# Patient Record
Sex: Female | Born: 1980 | Race: Black or African American | Hispanic: No | Marital: Single | Smoking: Current every day smoker
Health system: Southern US, Community
[De-identification: ages and names within clinical notes are randomized; demographics above are authoritative.]

## PROBLEM LIST (undated history)

## (undated) DIAGNOSIS — E669 Obesity, unspecified: Secondary | ICD-10-CM

---
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
## 2003-07-13 ENCOUNTER — Encounter: Payer: Self-pay | Admitting: Emergency Medicine

## 2003-07-13 ENCOUNTER — Emergency Department (HOSPITAL_COMMUNITY): Admission: EM | Admit: 2003-07-13 | Discharge: 2003-07-13 | Payer: Self-pay | Admitting: Emergency Medicine

## 2003-08-15 ENCOUNTER — Inpatient Hospital Stay (HOSPITAL_COMMUNITY): Admission: AD | Admit: 2003-08-15 | Discharge: 2003-08-15 | Payer: Self-pay | Admitting: Specialist

## 2003-08-21 ENCOUNTER — Encounter: Payer: Self-pay | Admitting: Obstetrics & Gynecology

## 2003-08-21 ENCOUNTER — Ambulatory Visit (HOSPITAL_COMMUNITY): Admission: RE | Admit: 2003-08-21 | Discharge: 2003-08-21 | Payer: Self-pay | Admitting: Obstetrics & Gynecology

## 2003-10-23 ENCOUNTER — Inpatient Hospital Stay (HOSPITAL_COMMUNITY): Admission: AD | Admit: 2003-10-23 | Discharge: 2003-11-01 | Payer: Self-pay | Admitting: Obstetrics

## 2003-10-24 ENCOUNTER — Encounter (INDEPENDENT_AMBULATORY_CARE_PROVIDER_SITE_OTHER): Payer: Self-pay

## 2004-09-17 ENCOUNTER — Ambulatory Visit: Payer: Self-pay | Admitting: Family Medicine

## 2004-09-30 ENCOUNTER — Ambulatory Visit: Payer: Self-pay | Admitting: Family Medicine

## 2004-12-09 ENCOUNTER — Ambulatory Visit: Payer: Self-pay | Admitting: Family Medicine

## 2005-02-10 ENCOUNTER — Ambulatory Visit: Payer: Self-pay | Admitting: Family Medicine

## 2005-03-18 ENCOUNTER — Ambulatory Visit: Payer: Self-pay | Admitting: Family Medicine

## 2005-03-19 ENCOUNTER — Ambulatory Visit: Payer: Self-pay | Admitting: Family Medicine

## 2005-04-10 ENCOUNTER — Ambulatory Visit: Payer: Self-pay | Admitting: Family Medicine

## 2005-04-11 ENCOUNTER — Ambulatory Visit (HOSPITAL_COMMUNITY): Admission: RE | Admit: 2005-04-11 | Discharge: 2005-04-11 | Payer: Self-pay | Admitting: Family Medicine

## 2005-06-16 ENCOUNTER — Ambulatory Visit: Payer: Self-pay | Admitting: Family Medicine

## 2005-06-17 ENCOUNTER — Ambulatory Visit: Payer: Self-pay | Admitting: Family Medicine

## 2005-08-24 ENCOUNTER — Emergency Department (HOSPITAL_COMMUNITY): Admission: EM | Admit: 2005-08-24 | Discharge: 2005-08-24 | Payer: Self-pay | Admitting: Emergency Medicine

## 2005-08-25 ENCOUNTER — Ambulatory Visit: Payer: Self-pay | Admitting: Nurse Practitioner

## 2005-08-27 ENCOUNTER — Emergency Department (HOSPITAL_COMMUNITY): Admission: EM | Admit: 2005-08-27 | Discharge: 2005-08-27 | Payer: Self-pay | Admitting: Emergency Medicine

## 2005-09-05 ENCOUNTER — Ambulatory Visit: Payer: Self-pay | Admitting: Family Medicine

## 2005-09-08 ENCOUNTER — Ambulatory Visit: Payer: Self-pay | Admitting: Family Medicine

## 2005-10-07 ENCOUNTER — Other Ambulatory Visit: Admission: RE | Admit: 2005-10-07 | Discharge: 2005-10-07 | Payer: Self-pay | Admitting: Family Medicine

## 2005-10-07 ENCOUNTER — Ambulatory Visit: Payer: Self-pay | Admitting: Family Medicine

## 2005-10-07 LAB — CONVERTED CEMR LAB: Pap Smear: ABNORMAL

## 2005-12-04 ENCOUNTER — Ambulatory Visit: Payer: Self-pay | Admitting: Family Medicine

## 2005-12-25 ENCOUNTER — Ambulatory Visit: Payer: Self-pay | Admitting: Family Medicine

## 2006-04-01 ENCOUNTER — Ambulatory Visit: Payer: Self-pay | Admitting: Family Medicine

## 2006-04-02 ENCOUNTER — Ambulatory Visit: Payer: Self-pay | Admitting: Family Medicine

## 2006-05-15 ENCOUNTER — Ambulatory Visit: Payer: Self-pay | Admitting: Family Medicine

## 2006-05-30 ENCOUNTER — Emergency Department (HOSPITAL_COMMUNITY): Admission: EM | Admit: 2006-05-30 | Discharge: 2006-05-30 | Payer: Self-pay | Admitting: Family Medicine

## 2006-05-31 ENCOUNTER — Emergency Department (HOSPITAL_COMMUNITY): Admission: EM | Admit: 2006-05-31 | Discharge: 2006-05-31 | Payer: Self-pay | Admitting: Family Medicine

## 2006-06-02 ENCOUNTER — Ambulatory Visit: Payer: Self-pay | Admitting: Family Medicine

## 2006-08-26 ENCOUNTER — Emergency Department (HOSPITAL_COMMUNITY): Admission: EM | Admit: 2006-08-26 | Discharge: 2006-08-26 | Payer: Self-pay | Admitting: Family Medicine

## 2006-08-31 ENCOUNTER — Emergency Department (HOSPITAL_COMMUNITY): Admission: EM | Admit: 2006-08-31 | Discharge: 2006-08-31 | Payer: Self-pay | Admitting: Family Medicine

## 2006-11-06 ENCOUNTER — Emergency Department (HOSPITAL_COMMUNITY): Admission: EM | Admit: 2006-11-06 | Discharge: 2006-11-06 | Payer: Self-pay | Admitting: Emergency Medicine

## 2007-05-20 ENCOUNTER — Emergency Department (HOSPITAL_COMMUNITY): Admission: EM | Admit: 2007-05-20 | Discharge: 2007-05-20 | Payer: Self-pay | Admitting: Family Medicine

## 2007-06-03 ENCOUNTER — Emergency Department (HOSPITAL_COMMUNITY): Admission: EM | Admit: 2007-06-03 | Discharge: 2007-06-03 | Payer: Self-pay | Admitting: Emergency Medicine

## 2007-06-07 ENCOUNTER — Encounter (INDEPENDENT_AMBULATORY_CARE_PROVIDER_SITE_OTHER): Payer: Self-pay | Admitting: Family Medicine

## 2007-06-07 DIAGNOSIS — F329 Major depressive disorder, single episode, unspecified: Secondary | ICD-10-CM

## 2007-06-07 DIAGNOSIS — E785 Hyperlipidemia, unspecified: Secondary | ICD-10-CM | POA: Insufficient documentation

## 2007-06-07 DIAGNOSIS — M545 Low back pain: Secondary | ICD-10-CM

## 2007-06-07 DIAGNOSIS — I10 Essential (primary) hypertension: Secondary | ICD-10-CM | POA: Insufficient documentation

## 2007-08-09 ENCOUNTER — Inpatient Hospital Stay (HOSPITAL_COMMUNITY): Admission: RE | Admit: 2007-08-09 | Discharge: 2007-08-11 | Payer: Self-pay | Admitting: *Deleted

## 2007-08-09 ENCOUNTER — Ambulatory Visit: Payer: Self-pay | Admitting: *Deleted

## 2007-08-09 ENCOUNTER — Emergency Department (HOSPITAL_COMMUNITY): Admission: EM | Admit: 2007-08-09 | Discharge: 2007-08-09 | Payer: Self-pay | Admitting: Emergency Medicine

## 2007-08-12 ENCOUNTER — Other Ambulatory Visit (HOSPITAL_COMMUNITY): Admission: RE | Admit: 2007-08-12 | Discharge: 2007-08-26 | Payer: Self-pay | Admitting: Psychiatry

## 2007-09-08 ENCOUNTER — Emergency Department (HOSPITAL_COMMUNITY): Admission: EM | Admit: 2007-09-08 | Discharge: 2007-09-09 | Payer: Self-pay | Admitting: Emergency Medicine

## 2007-09-16 ENCOUNTER — Other Ambulatory Visit (HOSPITAL_COMMUNITY): Admission: RE | Admit: 2007-09-16 | Discharge: 2007-12-15 | Payer: Self-pay | Admitting: Psychiatry

## 2007-09-24 ENCOUNTER — Ambulatory Visit (HOSPITAL_COMMUNITY): Payer: Self-pay | Admitting: Psychiatry

## 2007-09-29 ENCOUNTER — Ambulatory Visit (HOSPITAL_COMMUNITY): Payer: Self-pay | Admitting: Psychiatry

## 2007-10-12 ENCOUNTER — Ambulatory Visit (HOSPITAL_COMMUNITY): Payer: Self-pay | Admitting: Psychiatry

## 2007-12-02 ENCOUNTER — Inpatient Hospital Stay (HOSPITAL_COMMUNITY): Admission: AD | Admit: 2007-12-02 | Discharge: 2007-12-02 | Payer: Self-pay | Admitting: Obstetrics & Gynecology

## 2008-01-11 ENCOUNTER — Inpatient Hospital Stay (HOSPITAL_COMMUNITY): Admission: AD | Admit: 2008-01-11 | Discharge: 2008-01-11 | Payer: Self-pay | Admitting: Obstetrics & Gynecology

## 2008-03-01 ENCOUNTER — Ambulatory Visit (HOSPITAL_COMMUNITY): Admission: RE | Admit: 2008-03-01 | Discharge: 2008-03-01 | Payer: Self-pay | Admitting: Obstetrics

## 2008-04-27 ENCOUNTER — Emergency Department (HOSPITAL_COMMUNITY): Admission: EM | Admit: 2008-04-27 | Discharge: 2008-04-27 | Payer: Self-pay | Admitting: Family Medicine

## 2008-07-01 ENCOUNTER — Inpatient Hospital Stay (HOSPITAL_COMMUNITY): Admission: AD | Admit: 2008-07-01 | Discharge: 2008-07-01 | Payer: Self-pay | Admitting: Obstetrics & Gynecology

## 2008-07-10 ENCOUNTER — Inpatient Hospital Stay (HOSPITAL_COMMUNITY): Admission: AD | Admit: 2008-07-10 | Discharge: 2008-07-10 | Payer: Self-pay | Admitting: Obstetrics & Gynecology

## 2008-07-20 ENCOUNTER — Inpatient Hospital Stay (HOSPITAL_COMMUNITY): Admission: AD | Admit: 2008-07-20 | Discharge: 2008-07-20 | Payer: Self-pay | Admitting: Obstetrics

## 2008-07-27 ENCOUNTER — Encounter: Payer: Self-pay | Admitting: Obstetrics

## 2008-07-27 ENCOUNTER — Inpatient Hospital Stay (HOSPITAL_COMMUNITY): Admission: RE | Admit: 2008-07-27 | Discharge: 2008-07-30 | Payer: Self-pay | Admitting: Obstetrics

## 2008-10-11 ENCOUNTER — Emergency Department (HOSPITAL_COMMUNITY): Admission: EM | Admit: 2008-10-11 | Discharge: 2008-10-11 | Payer: Self-pay | Admitting: Family Medicine

## 2008-12-08 IMAGING — US US OB TRANSVAGINAL MODIFY
1 series · 14 of 28 positions shown · non-contrast
Comparison: none

CLINICAL DATA: Patient reportedly 12 weeks pregnant.  Pain in lower stomach.
 OBSTETRICAL ULTRASOUND <14 WKS AND TRANSVAGINAL OB US:
TECHNIQUE: Both transabdominal and transvaginal ultrasound examinations were performed for complete evaluation of the gestation as well as the maternal uterus, adnexal regions, and pelvic cul-de-sac.

[Series 1: us ob transvaginal modify · 0.31mm/px · 45 acquisitions, 14 frames shown]
[im 2/45]
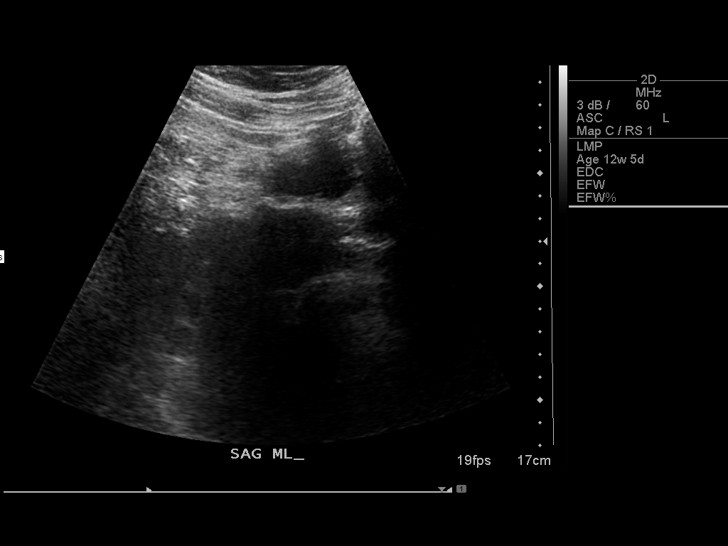
[im 5/45]
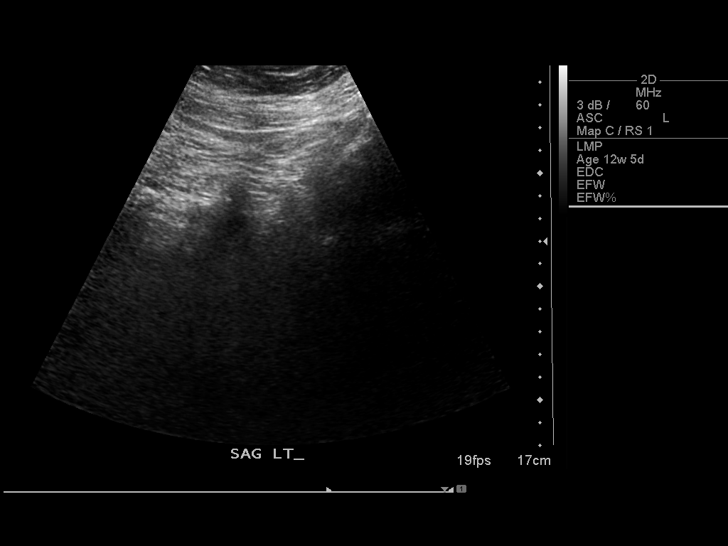
[im 9/45]
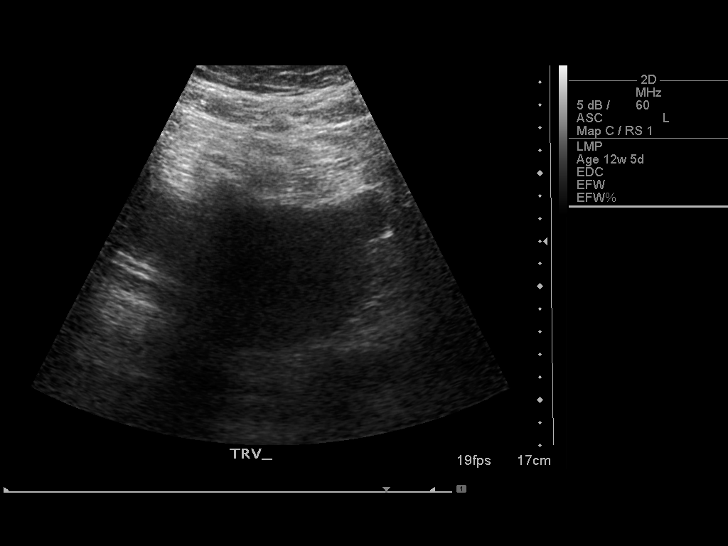
[im 12/45]
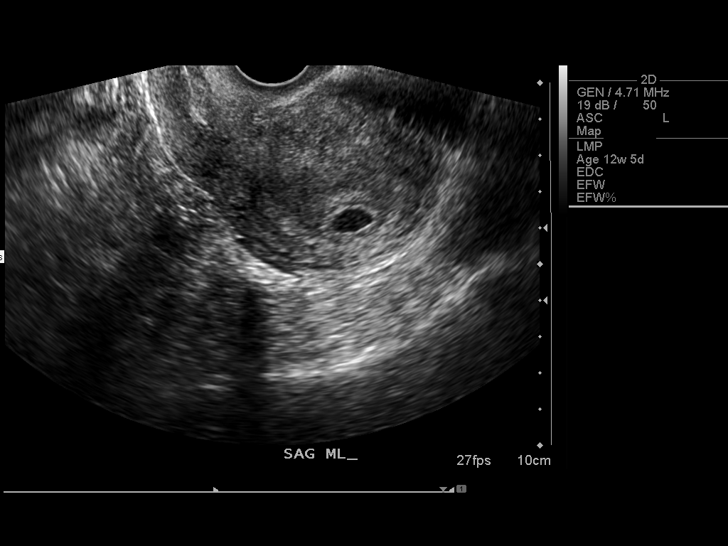
[im 15/45]
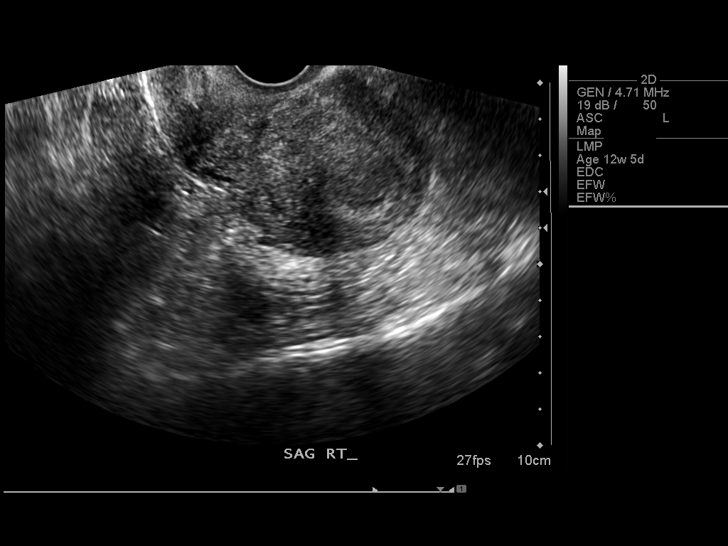
[im 18/45]
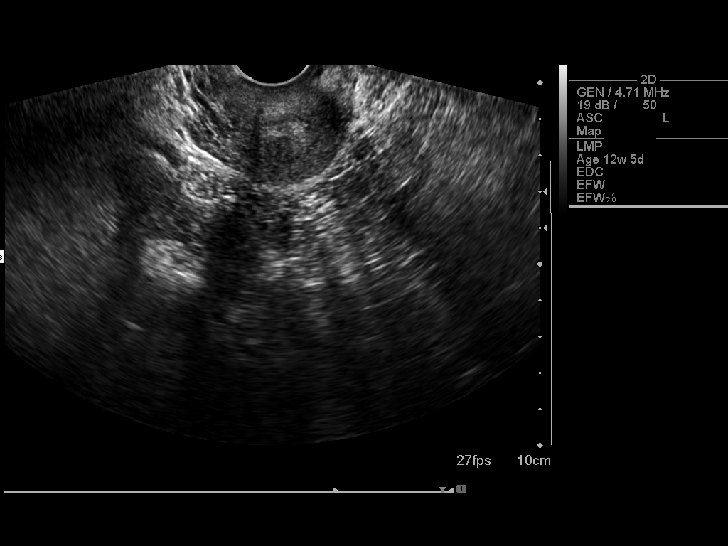
[im 22/45]
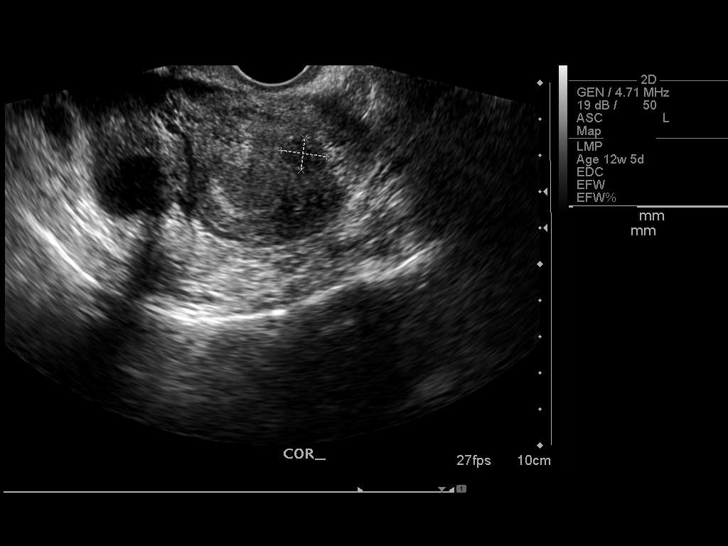
[im 25/45]
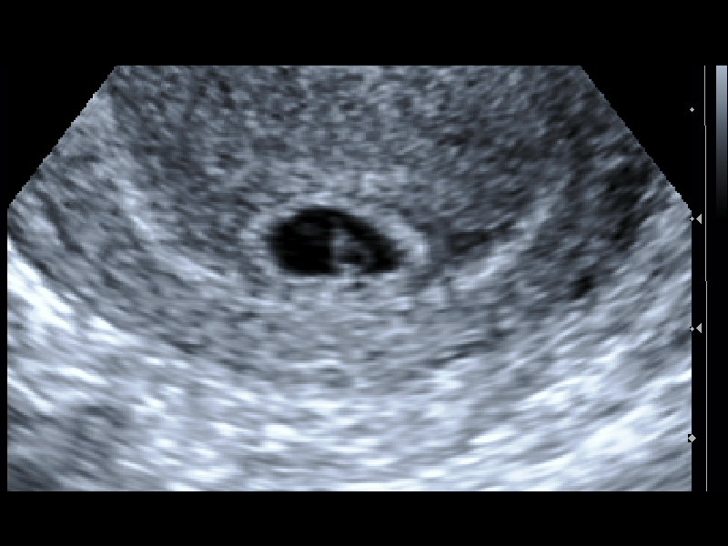
[im 28/45]
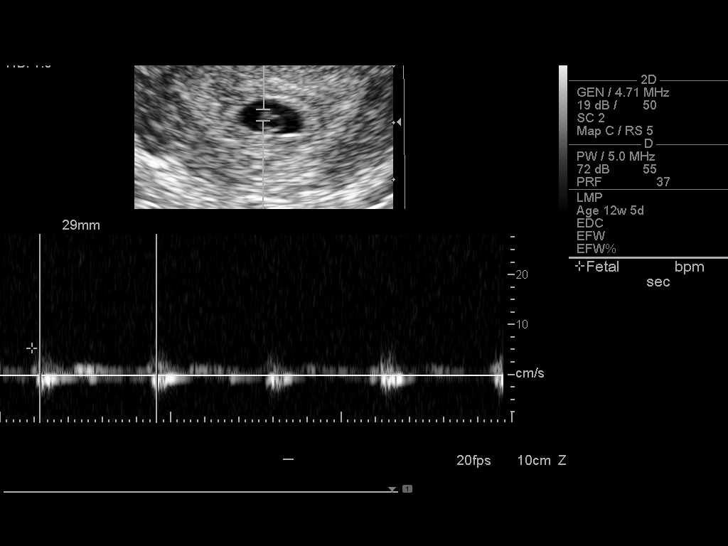
[im 31/45]
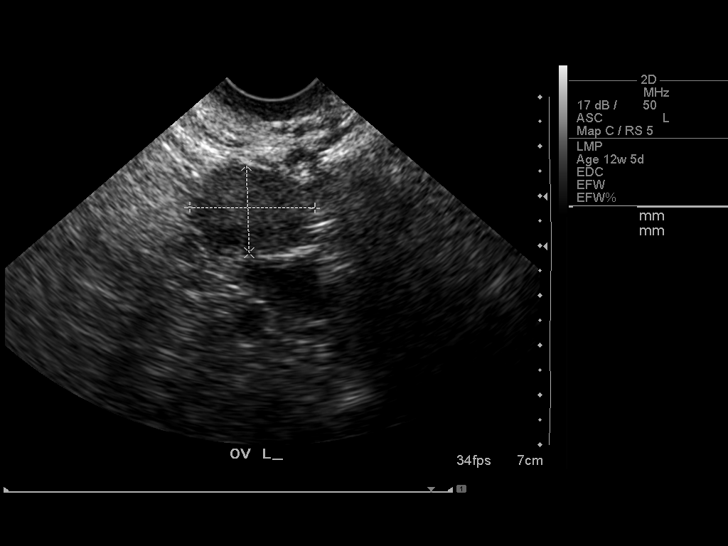
[im 35/45]
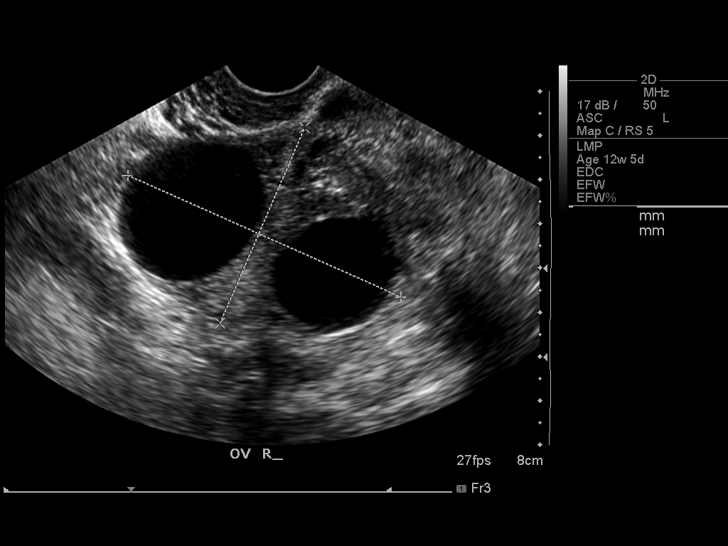
[im 38/45]
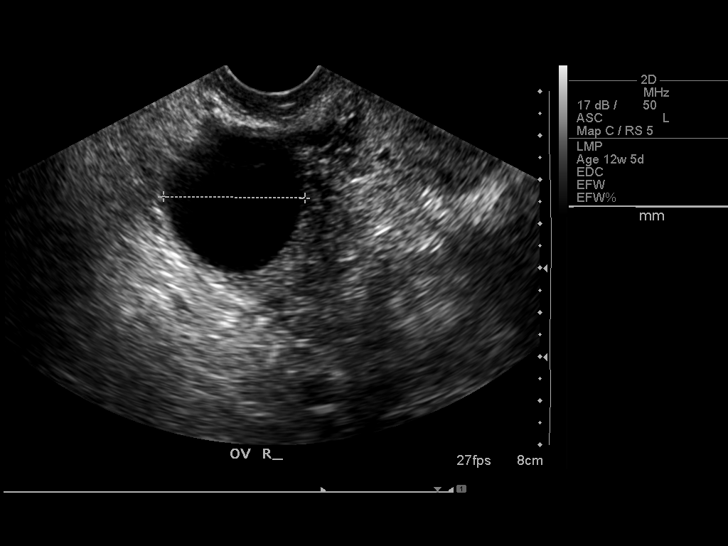
[im 41/45]
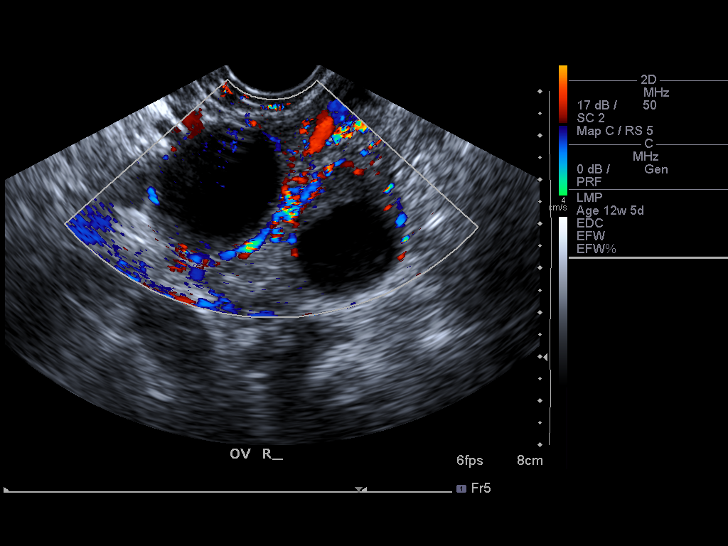
[im 45/45]
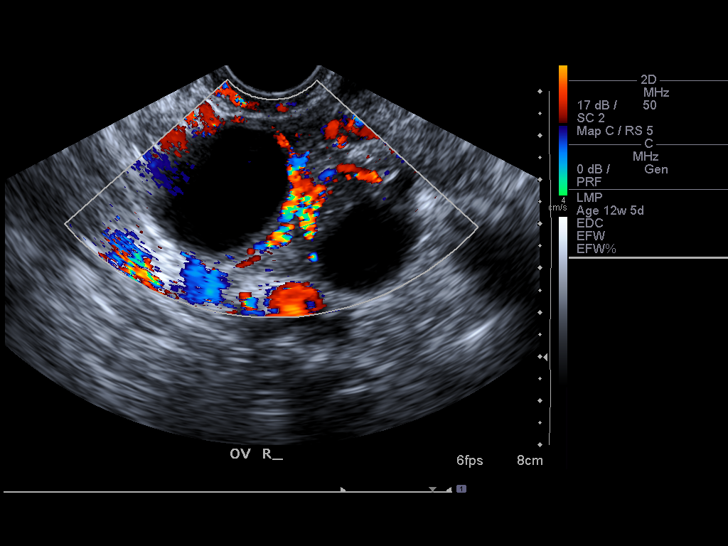

[14 of 28 positions shown; findings below may reference images not displayed]

FINDINGS: Intrauterine gestational sac is identified.  Yolk sac is identified.  Embryo with cardiac activity noted.  Cardiac activity 89 and regular.  Crown-rump length 2.1 mm corresponds with EDC 5 weeks 5 days.  There is no subchorionic hematoma.  Within the right ovary, two simple cysts identified.  Left ovary is normal.  Note is made of a 1.2 x 0.8 cm fibroid within the uterine fundus.
IMPRESSION: Single intrauterine fetus identified.  Estimated gestational age 5 weeks 5 days basewed on a crown-rump length 2.1 mm.  This corresponds with EDC of 07/29/08.

## 2009-01-30 ENCOUNTER — Inpatient Hospital Stay (HOSPITAL_COMMUNITY): Admission: RE | Admit: 2009-01-30 | Discharge: 2009-02-07 | Payer: Self-pay | Admitting: Internal Medicine

## 2009-01-30 ENCOUNTER — Emergency Department (HOSPITAL_COMMUNITY): Admission: EM | Admit: 2009-01-30 | Discharge: 2009-01-30 | Payer: Self-pay | Admitting: Emergency Medicine

## 2009-01-31 ENCOUNTER — Ambulatory Visit: Payer: Self-pay | Admitting: Psychiatry

## 2009-02-28 ENCOUNTER — Ambulatory Visit (HOSPITAL_COMMUNITY): Payer: Self-pay | Admitting: Psychiatry

## 2009-03-25 ENCOUNTER — Ambulatory Visit: Payer: Self-pay | Admitting: Advanced Practice Midwife

## 2009-03-25 ENCOUNTER — Inpatient Hospital Stay (HOSPITAL_COMMUNITY): Admission: AD | Admit: 2009-03-25 | Discharge: 2009-03-25 | Payer: Self-pay | Admitting: Obstetrics & Gynecology

## 2009-04-04 ENCOUNTER — Other Ambulatory Visit (HOSPITAL_COMMUNITY): Admission: RE | Admit: 2009-04-04 | Discharge: 2009-04-19 | Payer: Self-pay | Admitting: Psychiatry

## 2009-04-11 ENCOUNTER — Emergency Department (HOSPITAL_COMMUNITY): Admission: EM | Admit: 2009-04-11 | Discharge: 2009-04-11 | Payer: Self-pay | Admitting: Emergency Medicine

## 2009-07-28 ENCOUNTER — Inpatient Hospital Stay (HOSPITAL_COMMUNITY): Admission: AD | Admit: 2009-07-28 | Discharge: 2009-07-29 | Payer: Self-pay | Admitting: Obstetrics and Gynecology

## 2009-08-16 ENCOUNTER — Inpatient Hospital Stay (HOSPITAL_COMMUNITY): Admission: AD | Admit: 2009-08-16 | Discharge: 2009-08-16 | Payer: Self-pay | Admitting: Obstetrics and Gynecology

## 2009-08-17 ENCOUNTER — Inpatient Hospital Stay (HOSPITAL_COMMUNITY): Admission: AD | Admit: 2009-08-17 | Discharge: 2009-08-18 | Payer: Self-pay | Admitting: Obstetrics & Gynecology

## 2009-08-20 ENCOUNTER — Inpatient Hospital Stay (HOSPITAL_COMMUNITY): Admission: AD | Admit: 2009-08-20 | Discharge: 2009-08-20 | Payer: Self-pay | Admitting: Obstetrics & Gynecology

## 2009-08-20 ENCOUNTER — Inpatient Hospital Stay (HOSPITAL_COMMUNITY): Admission: AD | Admit: 2009-08-20 | Discharge: 2009-08-21 | Payer: Self-pay | Admitting: Obstetrics and Gynecology

## 2009-08-22 ENCOUNTER — Inpatient Hospital Stay (HOSPITAL_COMMUNITY): Admission: AD | Admit: 2009-08-22 | Discharge: 2009-08-23 | Payer: Self-pay | Admitting: Obstetrics and Gynecology

## 2009-09-17 ENCOUNTER — Inpatient Hospital Stay (HOSPITAL_COMMUNITY): Admission: RE | Admit: 2009-09-17 | Discharge: 2009-09-20 | Payer: Self-pay | Admitting: Obstetrics & Gynecology

## 2009-09-17 ENCOUNTER — Encounter (INDEPENDENT_AMBULATORY_CARE_PROVIDER_SITE_OTHER): Payer: Self-pay | Admitting: Obstetrics & Gynecology

## 2009-09-24 ENCOUNTER — Other Ambulatory Visit: Payer: Self-pay | Admitting: Emergency Medicine

## 2009-09-24 ENCOUNTER — Other Ambulatory Visit: Payer: Self-pay

## 2009-09-24 ENCOUNTER — Inpatient Hospital Stay (HOSPITAL_COMMUNITY): Admission: EM | Admit: 2009-09-24 | Discharge: 2009-09-27 | Payer: Self-pay | Admitting: Psychiatry

## 2009-09-24 ENCOUNTER — Ambulatory Visit: Payer: Self-pay | Admitting: Psychiatry

## 2010-02-06 IMAGING — US US TRANSVAGINAL NON-OB
1 series · 14 of 25 positions shown · non-contrast
Comparison: None available.

CLINICAL DATA: Medical clearance.  Suicidal.  Positive urine
pregnancy test.  LMP 12/15/2008.

TWIN OBSTETRIC <14WK US AND TRANSVAGINAL OB US

[Series 1: us transvaginal non-ob · 0.35mm/px · 14 of 38 slices shown]
[im 1/38]
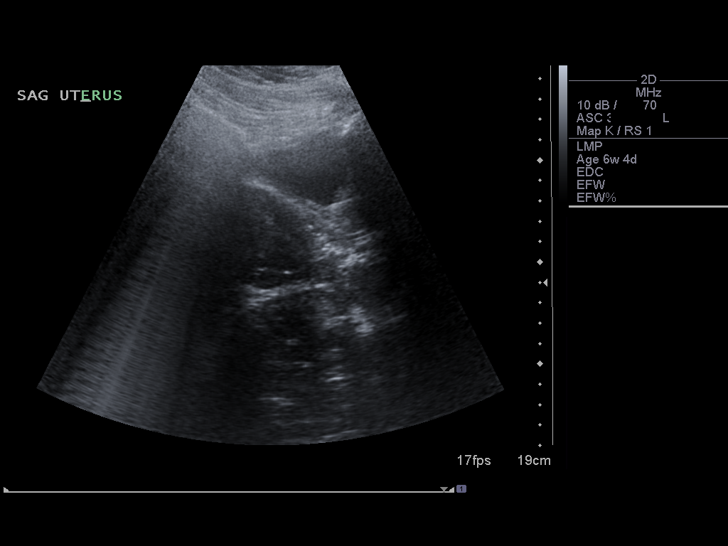
[im 4/38]
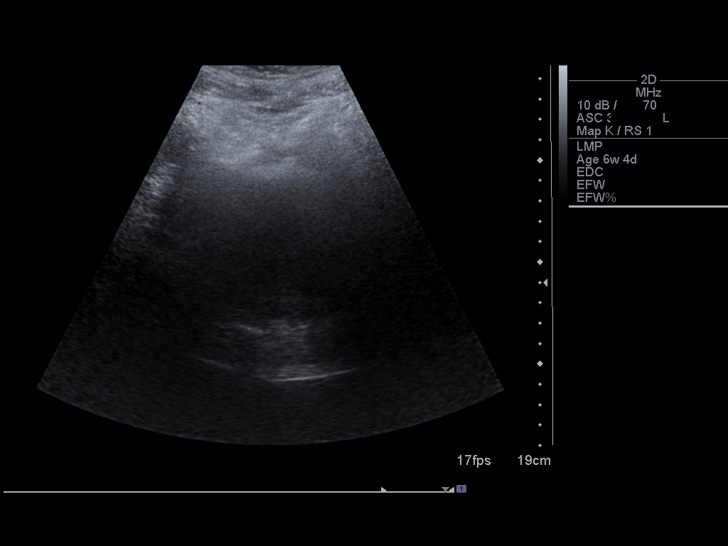
[im 7/38]
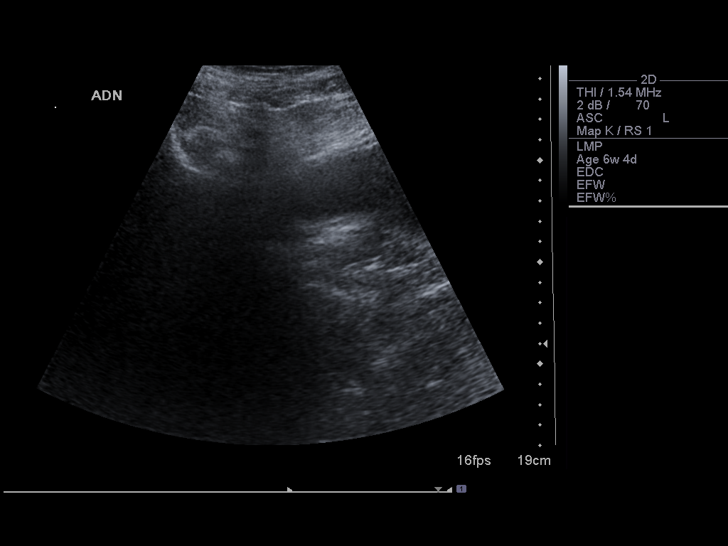
[im 10/38]
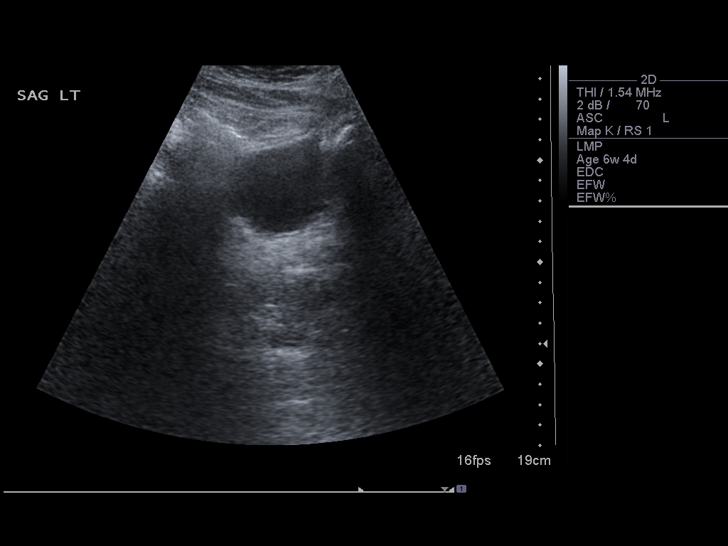
[im 13/38]
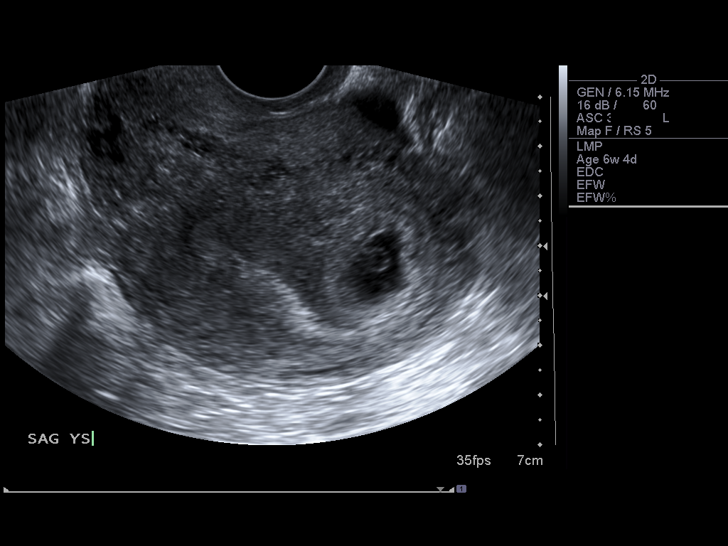
[im 14/38]
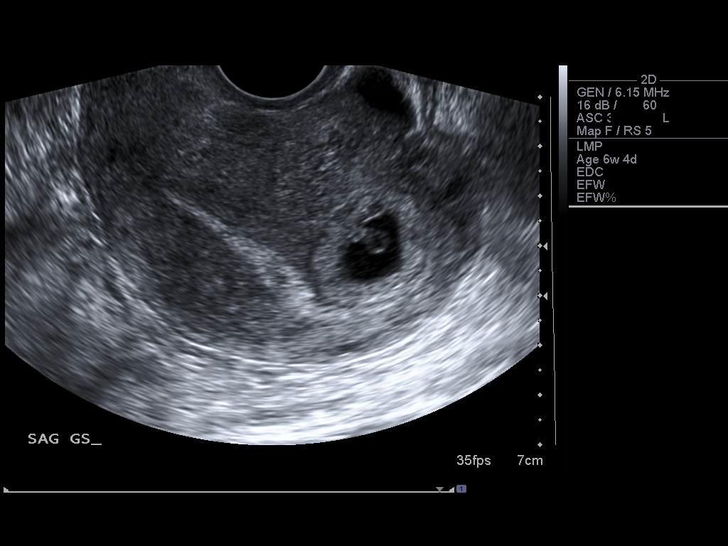
[im 17/38]
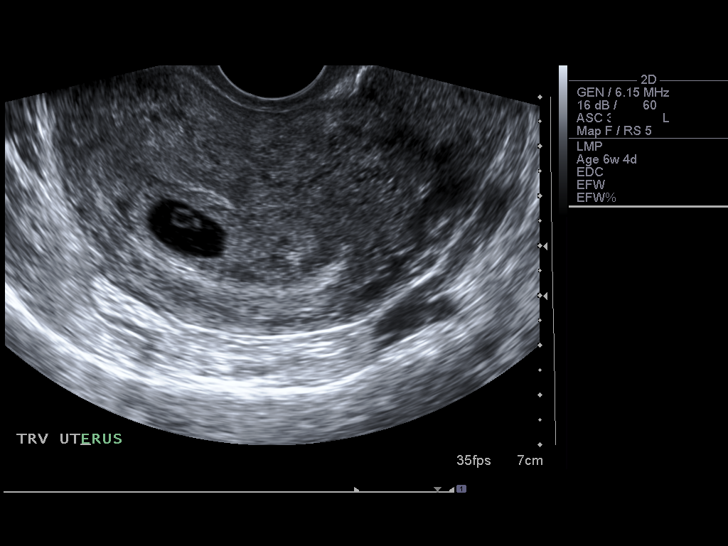
[im 21/38]
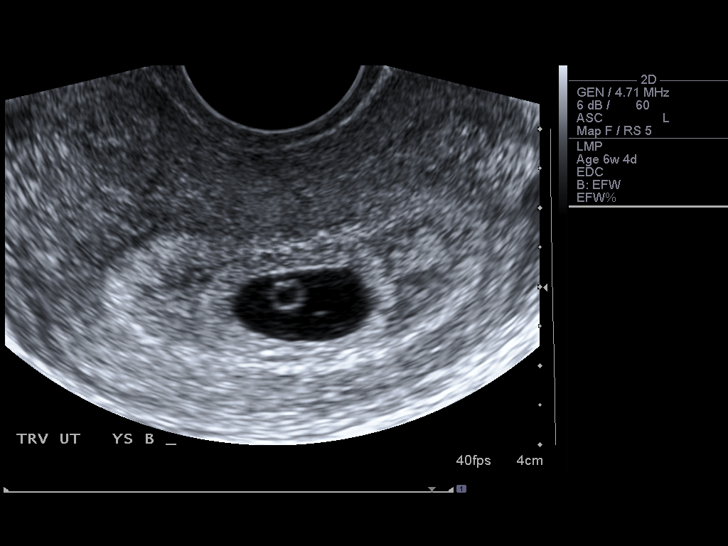
[im 24/38]
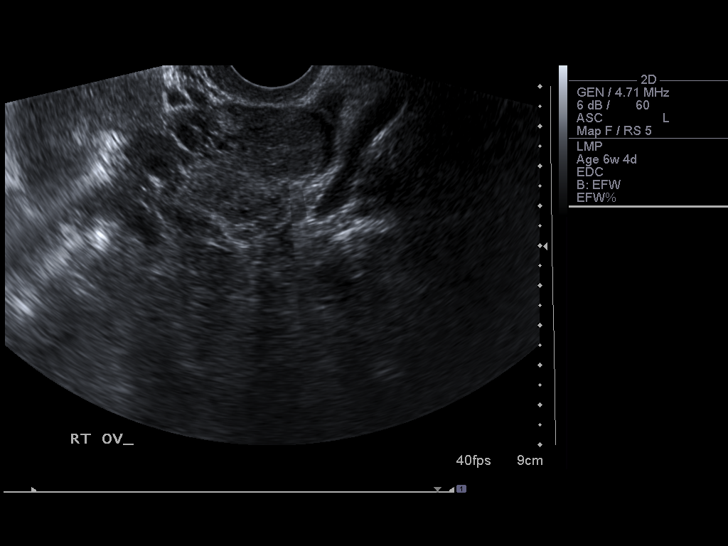
[im 25/38]
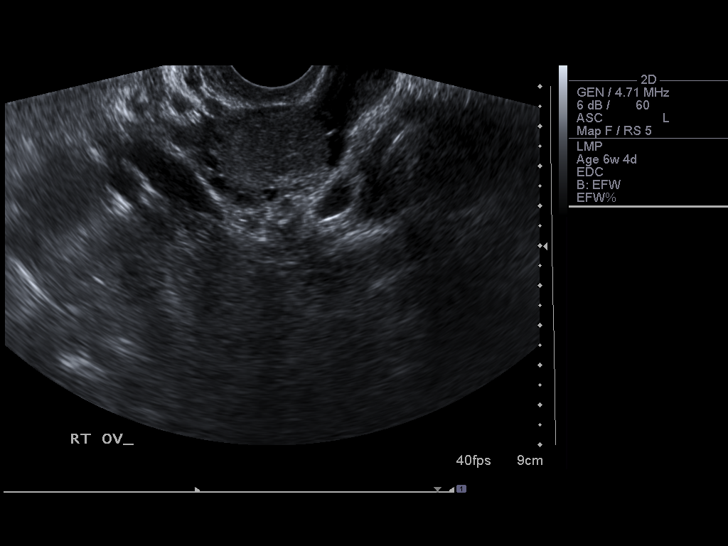
[im 28/38]
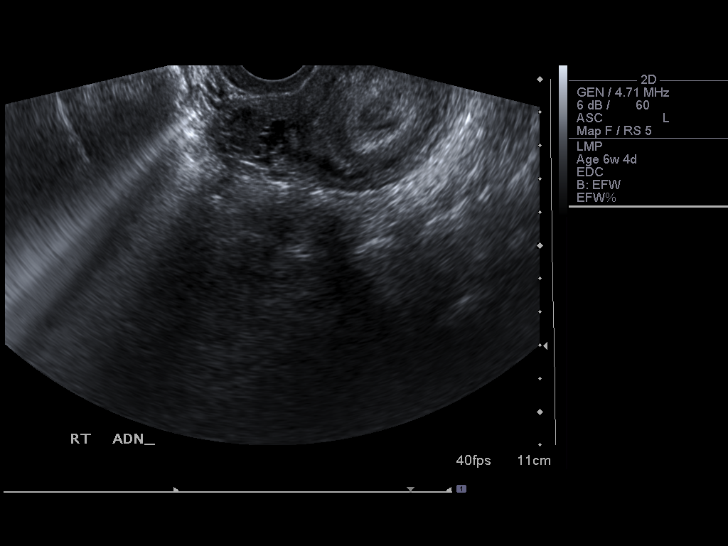
[im 31/38]
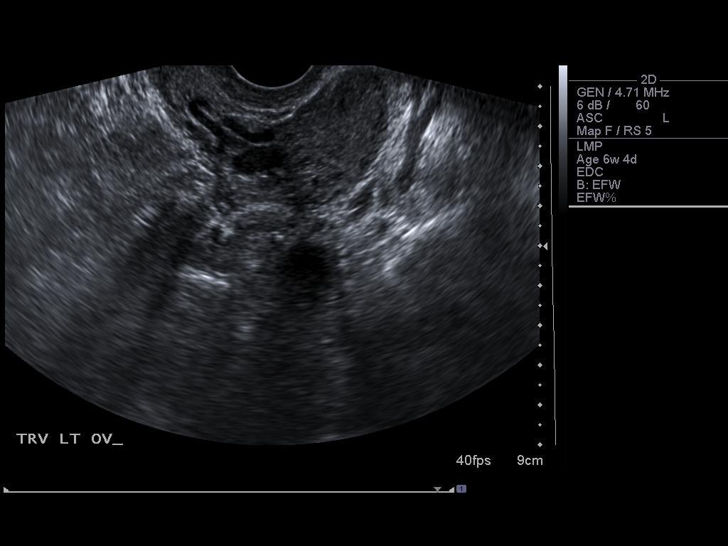
[im 34/38]
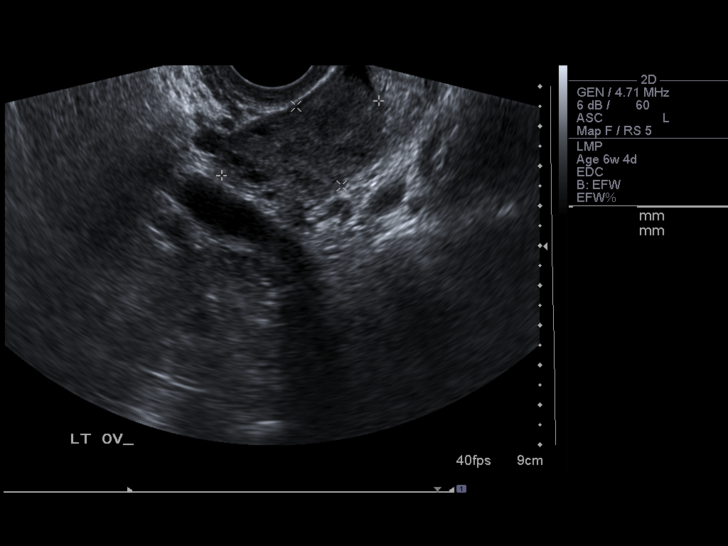
[im 38/38]
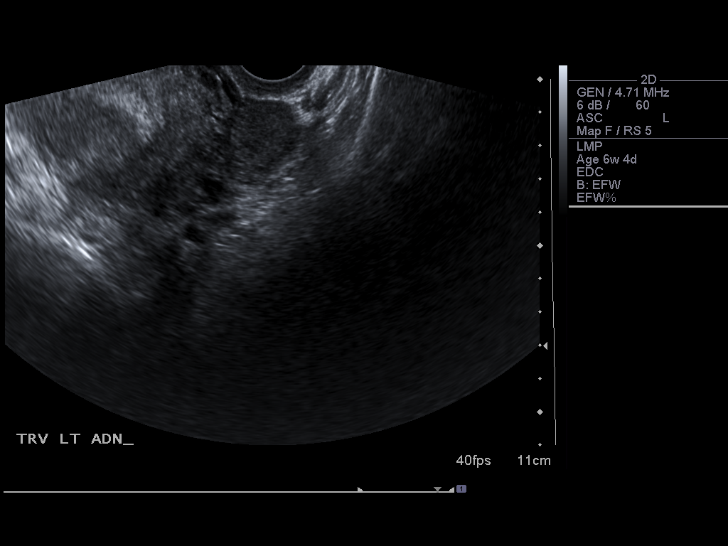

[14 of 25 positions shown; findings below may reference images not displayed]

TWIN A
Heart rate: 121 bpm
Yolk sac:  Present.

Embryo:  Present.

CRL: 5.3 mm 6w   2d          US EDC: 09/23/2009

Complete fetal anatomic evaluation could not be performed due to
early GA.

TWIN B
Heart rate:  889bpm
Yolk sac:  Present.

Embryo:  Present.

CRL:  4.9 mm      6w    2d          US EDC: 09/23/2009

Complete fetal anatomic evaluation could not be performed due to
early GA.

Maternal uterus/adnexae:
The ovaries are within normal limits bilaterally, measuring 3.3 x
2.2 x 2.0 cm on the right and 4.4 x 2.3 x 2.4 cm on the left.
There is no significant free fluid.  There is no significant
subchorionic hemorrhage. A thick septum divides the gestational
sacs.  Definitive characterization can be made after the placenta
is visible.
IMPRESSION: 1.  Early gestational twin pregnancy.
2.  The estimated gestational age is 6 weeks and 2 days.

## 2010-04-01 ENCOUNTER — Emergency Department (HOSPITAL_COMMUNITY): Admission: EM | Admit: 2010-04-01 | Discharge: 2010-04-01 | Payer: Self-pay | Admitting: Emergency Medicine

## 2010-04-01 ENCOUNTER — Ambulatory Visit: Payer: Self-pay | Admitting: Psychiatry

## 2010-04-01 ENCOUNTER — Inpatient Hospital Stay (HOSPITAL_COMMUNITY): Admission: EM | Admit: 2010-04-01 | Discharge: 2010-04-10 | Payer: Self-pay | Admitting: Psychiatry

## 2010-06-07 ENCOUNTER — Emergency Department (HOSPITAL_COMMUNITY): Admission: EM | Admit: 2010-06-07 | Discharge: 2010-06-07 | Payer: Self-pay | Admitting: Emergency Medicine

## 2010-10-25 ENCOUNTER — Emergency Department (HOSPITAL_COMMUNITY): Admission: EM | Admit: 2010-10-25 | Discharge: 2010-10-25 | Payer: Self-pay | Admitting: Family Medicine

## 2010-12-29 ENCOUNTER — Encounter: Payer: Self-pay | Admitting: Obstetrics & Gynecology

## 2011-02-25 LAB — DIFFERENTIAL
Basophils Absolute: 0.1 10*3/uL (ref 0.0–0.1)
Basophils Relative: 1 % (ref 0–1)
Eosinophils Absolute: 0.3 10*3/uL (ref 0.0–0.7)
Eosinophils Relative: 4 % (ref 0–5)
Lymphocytes Relative: 41 % (ref 12–46)
Lymphs Abs: 2.6 10*3/uL (ref 0.7–4.0)
Monocytes Absolute: 0.4 10*3/uL (ref 0.1–1.0)
Monocytes Relative: 7 % (ref 3–12)
Neutro Abs: 3 10*3/uL (ref 1.7–7.7)
Neutrophils Relative %: 47 % (ref 43–77)

## 2011-02-25 LAB — BASIC METABOLIC PANEL
BUN: 10 mg/dL (ref 6–23)
CO2: 21 mEq/L (ref 19–32)
Calcium: 8.7 mg/dL (ref 8.4–10.5)
Chloride: 104 mEq/L (ref 96–112)
Creatinine, Ser: 0.66 mg/dL (ref 0.4–1.2)
GFR calc Af Amer: >60 mL/min (ref >60)
GFR calc non Af Amer: >60 mL/min (ref >60)
Glucose, Bld: 91 mg/dL (ref 70–99)
Potassium: 3.7 mEq/L (ref 3.5–5.1)
Sodium: 135 mEq/L (ref 135–145)

## 2011-02-25 LAB — CBC
HCT: 35.2 % — ABNORMAL LOW (ref 36.0–46.0)
Hemoglobin: 11.3 g/dL — ABNORMAL LOW (ref 12.0–15.0)
MCHC: 32 g/dL (ref 30.0–36.0)
MCV: 81.3 fL (ref 78.0–100.0)
Platelets: 192 10*3/uL (ref 150–400)
RBC: 4.33 MIL/uL (ref 3.87–5.11)
RDW: 17.1 % — ABNORMAL HIGH (ref 11.5–15.5)
WBC: 6.3 10*3/uL (ref 4.0–10.5)

## 2011-02-25 LAB — URINALYSIS, ROUTINE W REFLEX MICROSCOPIC
Bilirubin Urine: NEGATIVE
Glucose, UA: NEGATIVE mg/dL
Hgb urine dipstick: NEGATIVE
Ketones, ur: NEGATIVE mg/dL
Nitrite: NEGATIVE
Protein, ur: NEGATIVE mg/dL
Specific Gravity, Urine: 1.008 (ref 1.005–1.030)
Urobilinogen, UA: 0.2 mg/dL (ref 0.0–1.0)
pH: 6 (ref 5.0–8.0)

## 2011-02-25 LAB — GC/CHLAMYDIA PROBE AMP, URINE
Chlamydia, Swab/Urine, PCR: NEGATIVE
GC Probe Amp, Urine: NEGATIVE

## 2011-02-25 LAB — RAPID URINE DRUG SCREEN, HOSP PERFORMED
Amphetamines: NOT DETECTED
Barbiturates: NOT DETECTED
Benzodiazepines: NOT DETECTED
Cocaine: NOT DETECTED
Opiates: NOT DETECTED
Tetrahydrocannabinol: POSITIVE — AB

## 2011-02-25 LAB — TSH: TSH: 1.984 u[IU]/mL (ref 0.350–4.500)

## 2011-02-25 LAB — RPR: RPR Ser Ql: NONREACTIVE

## 2011-02-25 LAB — PREGNANCY, URINE: Preg Test, Ur: NEGATIVE

## 2011-02-25 LAB — HEPATIC FUNCTION PANEL
Bilirubin, Direct: <0.1 mg/dL (ref 0.0–0.3)
Total Bilirubin: 0.3 mg/dL (ref 0.3–1.2)

## 2011-02-25 LAB — ETHANOL: Alcohol, Ethyl (B): 24 mg/dL — ABNORMAL HIGH (ref 0–10)

## 2011-03-13 LAB — CBC
HCT: 33.7 % — ABNORMAL LOW (ref 36.0–46.0)
HCT: 35.8 % — ABNORMAL LOW (ref 36.0–46.0)
Hemoglobin: 11 g/dL — ABNORMAL LOW (ref 12.0–15.0)
Hemoglobin: 11.5 g/dL — ABNORMAL LOW (ref 12.0–15.0)
MCHC: 32.2 g/dL (ref 30.0–36.0)
MCHC: 32.5 g/dL (ref 30.0–36.0)
MCV: 86.9 fL (ref 78.0–100.0)
MCV: 87 fL (ref 78.0–100.0)
Platelets: 110 10*3/uL — ABNORMAL LOW (ref 150–400)
Platelets: 167 10*3/uL (ref 150–400)
RBC: 3.9 MIL/uL (ref 3.87–5.11)
RBC: 3.24 MIL/uL — ABNORMAL LOW (ref 3.87–5.11)
RDW: 16.1 % — ABNORMAL HIGH (ref 11.5–15.5)
RDW: 15 % (ref 11.5–15.5)
RDW: 15.2 % (ref 11.5–15.5)

## 2011-03-13 LAB — BASIC METABOLIC PANEL
CO2: 27 mEq/L (ref 19–32)
Calcium: 8.5 mg/dL (ref 8.4–10.5)
GFR calc Af Amer: >60 mL/min (ref >60)
GFR calc non Af Amer: >60 mL/min (ref >60)
Glucose, Bld: 124 mg/dL — ABNORMAL HIGH (ref 70–99)
Potassium: 3.6 mEq/L (ref 3.5–5.1)
Sodium: 141 mEq/L (ref 135–145)

## 2011-03-13 LAB — DIFFERENTIAL
Basophils Absolute: 0.1 10*3/uL (ref 0.0–0.1)
Eosinophils Relative: 3 % (ref 0–5)
Lymphocytes Relative: 31 % (ref 12–46)
Monocytes Absolute: 0.3 10*3/uL (ref 0.1–1.0)
Monocytes Relative: 5 % (ref 3–12)
Neutro Abs: 3.8 10*3/uL (ref 1.7–7.7)

## 2011-03-13 LAB — RAPID URINE DRUG SCREEN, HOSP PERFORMED
Cocaine: NOT DETECTED
Opiates: NOT DETECTED

## 2011-03-13 LAB — PREGNANCY, URINE: Preg Test, Ur: POSITIVE

## 2011-03-14 LAB — DIFFERENTIAL
Eosinophils Absolute: 0.2 10*3/uL (ref 0.0–0.7)
Lymphs Abs: 2.7 10*3/uL (ref 0.7–4.0)
Monocytes Relative: 5 % (ref 3–12)
Neutro Abs: 5.1 10*3/uL (ref 1.7–7.7)
Neutrophils Relative %: 60 % (ref 43–77)

## 2011-03-14 LAB — CBC
MCV: 87.1 fL (ref 78.0–100.0)
Platelets: 163 10*3/uL (ref 150–400)
RBC: 3.72 MIL/uL — ABNORMAL LOW (ref 3.87–5.11)
WBC: 8.4 10*3/uL (ref 4.0–10.5)

## 2011-03-14 LAB — URINALYSIS, ROUTINE W REFLEX MICROSCOPIC
Glucose, UA: NEGATIVE mg/dL
Protein, ur: NEGATIVE mg/dL
Specific Gravity, Urine: 1.015 (ref 1.005–1.030)
Urobilinogen, UA: 0.2 mg/dL (ref 0.0–1.0)

## 2011-03-14 LAB — URINE CULTURE: Colony Count: >=100000

## 2011-03-14 LAB — URINE MICROSCOPIC-ADD ON

## 2011-03-18 LAB — GC/CHLAMYDIA PROBE AMP, GENITAL
Chlamydia, DNA Probe: NEGATIVE
GC Probe Amp, Genital: POSITIVE — AB

## 2011-03-18 LAB — URINE DRUGS OF ABUSE SCREEN W ALC, ROUTINE (REF LAB)
Barbiturate Quant, Ur: NEGATIVE
Creatinine,U: 253.5 mg/dL
Marijuana Metabolite: POSITIVE — AB
Methadone: NEGATIVE

## 2011-03-18 LAB — WET PREP, GENITAL

## 2011-03-19 LAB — WET PREP, GENITAL
Trich, Wet Prep: NONE SEEN
Yeast Wet Prep HPF POC: NONE SEEN

## 2011-03-19 LAB — URINALYSIS, ROUTINE W REFLEX MICROSCOPIC
Glucose, UA: NEGATIVE mg/dL
Specific Gravity, Urine: 1.015 (ref 1.005–1.030)
pH: 8 (ref 5.0–8.0)

## 2011-03-19 LAB — URINE MICROSCOPIC-ADD ON

## 2011-03-19 LAB — URINE CULTURE: Colony Count: >=100000

## 2011-03-19 LAB — GC/CHLAMYDIA PROBE AMP, GENITAL: GC Probe Amp, Genital: NEGATIVE

## 2011-03-19 LAB — RAPID URINE DRUG SCREEN, HOSP PERFORMED: Benzodiazepines: NOT DETECTED

## 2011-03-25 LAB — URINALYSIS, ROUTINE W REFLEX MICROSCOPIC
Bilirubin Urine: NEGATIVE
Glucose, UA: NEGATIVE mg/dL
Glucose, UA: NEGATIVE mg/dL
Hgb urine dipstick: NEGATIVE
Ketones, ur: 40 mg/dL — AB
Nitrite: NEGATIVE
Nitrite: NEGATIVE
Protein, ur: 30 mg/dL — AB
Protein, ur: 100 mg/dL — AB
Specific Gravity, Urine: 1.028 (ref 1.005–1.030)
Specific Gravity, Urine: 1.015 (ref 1.005–1.030)
Urobilinogen, UA: 1 mg/dL (ref 0.0–1.0)
Urobilinogen, UA: 0.2 mg/dL (ref 0.0–1.0)
pH: 7 (ref 5.0–8.0)
pH: 6.5 (ref 5.0–8.0)
pH: 7 (ref 5.0–8.0)

## 2011-03-25 LAB — URINE MICROSCOPIC-ADD ON

## 2011-03-25 LAB — POCT I-STAT, CHEM 8
BUN: 8 mg/dL (ref 6–23)
Calcium, Ion: 1.15 mmol/L (ref 1.12–1.32)
Chloride: 104 mEq/L (ref 96–112)
Glucose, Bld: 92 mg/dL (ref 70–99)
TCO2: 24 mmol/L (ref 0–100)

## 2011-03-25 LAB — RAPID URINE DRUG SCREEN, HOSP PERFORMED
Barbiturates: NOT DETECTED
Benzodiazepines: NOT DETECTED
Tetrahydrocannabinol: POSITIVE — AB

## 2011-03-25 LAB — DIFFERENTIAL
Basophils Absolute: 0.1 10*3/uL (ref 0.0–0.1)
Eosinophils Relative: 2 % (ref 0–5)
Lymphocytes Relative: 25 % (ref 12–46)
Monocytes Absolute: 0.5 10*3/uL (ref 0.1–1.0)
Monocytes Relative: 7 % (ref 3–12)
Neutro Abs: 4.6 10*3/uL (ref 1.7–7.7)

## 2011-03-25 LAB — URINE CULTURE: Culture: NO GROWTH

## 2011-03-25 LAB — CBC
HCT: 43.5 % (ref 36.0–46.0)
Hemoglobin: 14.1 g/dL (ref 12.0–15.0)
MCHC: 32.3 g/dL (ref 30.0–36.0)
RBC: 5.22 MIL/uL — ABNORMAL HIGH (ref 3.87–5.11)
RDW: 17.5 % — ABNORMAL HIGH (ref 11.5–15.5)

## 2011-03-25 LAB — ETHANOL: Alcohol, Ethyl (B): 5 mg/dL (ref 0–10)
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
## 2011-04-22 NOTE — Discharge Summary (Signed)
[name], [name]                ACCOUNT NO.:  [id number]   MEDICAL RECORD NO.:  [id number]          PATIENT TYPE:  IPS   LOCATION:  0301                          FACILITY:  [facility]   PHYSICIAN:  Jasmine Pang, M.D. DATE OF BIRTH:  January 07, 1981   DATE OF ADMISSION:  08/09/2007  DATE OF DISCHARGE:  08/11/2007                               DISCHARGE SUMMARY   IDENTIFYING INFORMATION:  This is a 30 year old married African-American  female who was admitted on a voluntary basis on August 09, 2007.   HISTORY OF PRESENT ILLNESS:  The patient states she has got a history of  feeling stressed out.  Stressors revolve around her occupation and  being a mother.  She has been having thoughts to hurt herself.  She  states she wanted to die.  She was going to stab herself with scissors  don't care anymore.  She was tearful.  She was stopped by a supervisor  when she put the scissors to her chest.  Other stressors revolve around  financial issues and lack of communication with husband.  She does state  a deterrent to self-harm or suicide is that she wants to be there for  her young son.   PAST PSYCHIATRIC HISTORY:  This is the first [facility] admission for the  patient.  She sees Dina Rich, therapist, in [place].  She has no  previous psych history.  No previous outpatient treatment.  She has a 41-  year-old child.  Child is with the patient's mother right now.  She is a  Engineer, building services.  She denies any history of  family psychiatric problems.   ALCOHOL/DRUG HISTORY:  She admits to using THC.  She denies other drug  or alcohol use.   MEDICAL HISTORY:  She has no acute or chronic illness.   MEDICATIONS:  No medications.   ALLERGIES:  She has no known drug allergies.   PHYSICAL EXAMINATION:  A complete physical exam was done at the [facility] ED.  There were no acute physical problems noted.   LABORATORY FINDINGS:  UDS was positive for THC.  Alcohol level less  than  5.  Glucose was 119.  The other labs were done in the ED and evaluated  by the ED physician.  There were no abnormalities reported.   HOSPITAL COURSE:  Upon admission, the patient was started on trazodone  25-50 mg p.o. q.h.s. p.r.n. insomnia.  On August 10, 2007, she was  started on Allegra 60 mg, 1 p.o. p.o. b.i.d. p.r.n. for congestion and  Celexa 20 mg p.o. q.d.  The patient tolerated these medications well  with no significant side effects.  She was friendly and cooperative.  She participated appropriately in unit therapeutic groups and  activities.  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  She state she has been working hard but  financially cannot get on her feet.  She states her husband is from a  different world.  She states that she has had no history of psychiatric  treatment and never  would hurt herself and did not intend to hurt  herself.  She states she held the scissors to her chest to get attention  because of the distress she was in and to get some help.  On August 11, 2007, mental status had improved from admission status.  Mood was  euthymic.  Affect wide range.  No suicidal or homicidal ideation.  No  auditory or visual hallucinations.  No paranoia or delusions.  Thoughts  were logical and goal-directed.  Thought content no predominant theme.  Cognitive was grossly within normal limits.  The patient was going to be  picked up by her husband in the hospital.  She plan to go to the  intensive outpatient program at [facility] and  was going to take some time off from work while she was getting  treatment.   DISCHARGE DIAGNOSES:  AXIS I:  Major depressive disorder, recurrent,  severe.  Marijuana abuse.  AXIS II:  None.  AXIS III:  No acute or chronic medical problems.  AXIS IV:  Severe (problems with primary support group, occupational  problem, other psychosocial problems, burden of  psychiatric illness).  AXIS V:  GAF upon discharge 50; GAF upon admission 35; GAF highest past  year 65-70.   ACTIVITY/DIET:  There were no specific activity level or dietary  restrictions.   POST-HOSPITAL CARE PLANS:  The patient will go to the intensive  outpatient program at [facility] on August 12, 2007  at 8:45 a.m.   DISCHARGE MEDICATIONS:  1. Celexa 20 mg daily.  2. Trazodone 50 mg, 1/2 to 1 pill at bedtime p.r.n. insomnia.      Jasmine Pang, M.D.  Electronically Signed     BHS/MEDQ  D:  08/11/2007  T:  08/11/2007  Job:  [id number]

## 2011-04-22 NOTE — H&P (Signed)
[name], [name]                ACCOUNT NO.:  [id number]   MEDICAL RECORD NO.:  [id number]          PATIENT TYPE:  IPS   LOCATION:  0301                          FACILITY:  [facility]   PHYSICIAN:  Jasmine Pang, M.D. DATE OF BIRTH:  03/10/1981   DATE OF ADMISSION:  08/09/2007  DATE OF DISCHARGE:  08/11/2007                       PSYCHIATRIC ADMISSION ASSESSMENT   IDENTIFYING INFORMATION:  This is a 30 year old married African-American  female voluntarily admitted on August 09, 2007.   HISTORY OF PRESENT ILLNESS:  The patient presents with a history of  feeling stressed out with her occupation, working more than 40 hours a  week and being a mother.  She has been having thoughts to hurt herself  and wanted to die.  She was going to stab herself with scissors at  work.  Her supervisor had intervened and suggested that the patient go  to the emergency room for assessment.  She states she doesn't care  anymore.  She is feeling very tearful.  Her stressors are her financial  problems, lack of communicating with her husband.  She states even  though she is very tearful today, she has not going to hurt herself.  Her deterrent is that she wants to be there for her son.   PAST PSYCHIATRIC HISTORY:  First admission to [facility].  Has been seeing Valere Dross, a therapist.  Reports a history of  postpartum depression.  No history of any past suicide attempts.  Has  never been on any type of antidepressant.   SOCIAL HISTORY:  Married African-American female, married for one year,  has a 24-year-old child, child is currently with the patient's mother.  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   FAMILY HISTORY:  None.   ALCOHOL/DRUG HISTORY:  The patient denies any drug use.  Urine drug  screen is positive for marijuana.   PRIMARY CARE PHYSICIAN:  Unknown.   MEDICAL PROBLEMS:  Denies any acute or chronic health issues.   MEDICATIONS:  No prior medications to this  admission.   ALLERGIES:  No known allergies.   PHYSICAL EXAMINATION:  The patient was fully assessed at [facility]  Emergency Department where she received Ultram.  Temperature is 98.3,  heart rate 97, respirations 16, blood pressure 131/92.  She is 308  pounds, height 5 feet 8 inches tall.   LABORATORY DATA:  Urine drug screen positive for THC.  Alcohol level  less than 5.  Glucose of 119.   MENTAL STATUS EXAM:  She is fully alert, cooperative, fair eye contact.  Her speech is clear, normal pace and tone.  Initiates conversation.  The  patient's mood is overwhelmed.  The patient's affect is tearful but  cooperative, has a sense humor, appropriate responses.  Thought  processes no evidence of any thought disorder.  Cognitive function  intact.  Memory is good.  Judgment and insight are fair.   DIAGNOSES:  AXIS I:  Major depressive disorder.  THC abuse.  AXIS II:  Deferred.  AXIS III:  No acute or chronic health issues.  AXIS IV:  Problems with occupation, financial issues,  psychosocial  problems, problems also with primary support group in regards to lack of  communication.  AXIS V:  Current 35-40.   PLAN:  Contract for safety.  Stabilize mood and thinking.  Will address  the substance abuse.  Will initiate Celexa.  Risk and benefits were  discussed with the patient.  The patient is agreeable to beginning  medication.  Will have a family session with her husband.  The patient  is to follow up with her therapy and reinforce medication compliance.   TENTATIVE LENGTH OF STAY:  Three to four days.      Landry Corporal, N.P.      Jasmine Pang, M.D.  Electronically Signed    JO/MEDQ  D:  08/11/2007  T:  08/11/2007  Job:  [id number]

## 2011-04-22 NOTE — Op Note (Signed)
[name], [name]                ACCOUNT NO.:  [id number]   MEDICAL RECORD NO.:  [id number]          PATIENT TYPE:  INP   LOCATION:  9134                          [facility]ILITY:  [facility]   PHYSICIAN:  Charles A. Clearance Coots, M.D.DATE OF BIRTH:  [birth year]   DATE OF PROCEDURE:  07/27/2008  DATE OF DISCHARGE:                               OPERATIVE REPORT   PREOP DIAGNOSIS:  Previous cesarean section at term, desires repeat  cesarean section.   POSTOP DIAGNOSIS:  Previous cesarean section at term, desires repeat  cesarean section.   PROCEDURE:  Repeat low-transverse cesarean section.   SURGEON:  Charles A. Clearance Coots, MD   ASSISTANT:  Kathreen Cosier, MD   ANESTHESIA:  Spinal.   ESTIMATED BLOOD LOSS:  600 mL.   IV FLUIDS:  3500 mL.   URINE OUTPUT:  100 mL clear.   COMPLICATIONS:  None.   Foley to gravity.   FINDINGS:  Viable female at 12:10, Apgars of 8 at 1 minute and 9 at 5  minutes, weight of 8 pounds and 14 ounces.  Normal uterus, ovaries, and  fallopian tubes.   SPECIMENS:  Placenta.   DISPOSITION:  Specimen pathology.   OPERATION:  The patient was brought to the operating room, and after  satisfactory spinal anesthesia, the abdomen was prepped and draped in  the usual sterile fashion.  A Pfannenstiel skin incision was made with  the scalpel through the previous scar down to the fascia.  The fascia  was nicked in the midline and the fascial incision was extended to the  left and to the right with curved Mayo scissors.  The superior and  inferior fascial edges were taken off of the rectus muscles both blunt  and sharp dissection.  Rectus muscle was bluntly divided in the midline  and the peritoneum was digitally entered and was digitally extended to  the left and to the right.  The bladder blade was positioned.  The  vesicouterine fold of the peritoneum above the reflection of the urinary  bladder was grasped with forceps and was incised and undermined with  Metzenbaum  scissors.  The incision was extended to left and to the right  with the Metzenbaum scissors.  The bladder blade was repositioned in  front of the urinary bladder placing it well out of the operative field.  The uterus was then entered transversely in the lower uterine segment  with the scalpel.  Clear amniotic fluid was expelled.  The uterine  incision was extended to the left and to the right with bandage  scissors.  The vertex was then brought up into the incision and was  delivered with the aid of fundal pressure from the assistant.  The  infant's mouth and nose were suctioned with a suction bulb and the  delivery was completed with the aid of fundal pressure from the  assistant.  Umbilical cord was doubly clamped and cut and the infant was  handed off to the nursery staff.  The placenta was spontaneously  expelled from the uterine cavity intact.  The endometrial surface was  thoroughly debrided with a  dry lap sponge.  The edges of the uterine  incision were grasped with a ring forceps.  Uterus was closed with a  continuous interlocking suture of 0 Monocryl after each corner was  suture ligated with 0 Monocryl.  Hemostasis was excellent.  The pelvic  cavity was then thoroughly irrigated with warm saline solution.  The  bladder flap was then closed with a continuous suture of 3-0 Monocryl.  The abdomen was then closed as follows.  Peritoneum was closed with a  continuous suture of 2-0 Vicryl.  The fascia was closed with a  continuous suture of 0 Vicryl.  Subcutaneous tissue was thoroughly  irrigated with warm saline solution.  All areas of subcutaneous bleeding  were coagulated with the Bovie.  Skin was then closed with a continuous  subcuticular suture of 4-0 Monocryl.  Sterile bandage was applied to the  incision closure.  Surgical technician indicated that all needle,  sponge, and instrument counts were correct x2.  The patient tolerated  the procedure well, transported to the  recovery room in satisfactory  condition.      Charles A. Clearance Coots, M.D.  Electronically Signed     CAH/MEDQ  D:  07/27/2008  T:  07/28/2008  Job:  [id number]

## 2011-04-25 NOTE — Op Note (Signed)
NAMESAROYA, RICCOBONO                          ACCOUNT NO.:  [id number]   MEDICAL RECORD NO.:  [id number]                   PATIENT TYPE:  INP   LOCATION:  9147                                 FACILITY:  [facility]   PHYSICIAN:  Charles A. Clearance Coots, M.D.             DATE OF BIRTH:  03/03/1981   DATE OF PROCEDURE:  10/24/2003  DATE OF DISCHARGE:                                 OPERATIVE REPORT   PREOPERATIVE DIAGNOSIS:  Nonreassuring fetal heart rate.   POSTOPERATIVE DIAGNOSIS:  Nonreassuring fetal heart rate.   PROCEDURE:  Primary low transverse cesarean section.   SURGEONS:  Kathreen Cosier, M.D., and Bing Neighbors. Clearance Coots, M.D.   ANESTHESIA:  Epidural.   ESTIMATED BLOOD LOSS:  800 mL.   IV FLUIDS:  2000 mL, urine out150 mL clear.   COMPLICATIONS:  None.   FINDINGS:  Viable female at 2048, Apgars of 8 at one minute and 9 at five  minutes, weight of 7 pounds 12 ounces.  Cord pH of 7.32.  Normal uterus,  ovaries and fallopian tubes.   DESCRIPTION OF PROCEDURE:  The patient is brought to the operating room and  after satisfactory redosing of the epidural, the abdomen was prepped and  draped in the usual sterile fashion.  Pfannenstiel skin incision was made  with the scalpel that was deepened down to the fascia with the scalpel.  Fascia was nicked in the midline and the fascial incision was extended to  the left and to the right with Mayo scissors.  The superior and inferior  fascial edges were taken off of the rectus muscles with sharp and blunt  dissection.  The rectus muscle was bluntly divided in the midline and  peritoneum was entered digitally and was digitally extended to the left and  to the right.  Bladder blade was positioned and the vesicouterine fold and  peritoneum above the reflection of the urinary bladder was grasped with  forceps and was incised and undermined with Metzenbaum scissors.  The  incision was extended to the left and to the right with Metzenbaum scissors.  The bladder flap was bluntly developed and the bladder blade was  repositioned from the urinary bladder, placing it well out of the operative  field.  The uterus was then entered transversely in the lower uterine  segment with the scalpel.  Clear amniotic fluid was expelled.  The uterine  incision was extended to the left and to the right with bandage scissors.  Vertex was noted to be occiput transverse.  The vertex was then delivered  with the aid of fundal pressure from the assistant.  The infant's mouth and  nose were suctioned with a suction bulb and delivery was completed with the  aid of fundal pressure from the assistant.  The umbilical cord was doubly  clamped and cut and the infant was handed off to the nursery staff.  Cord pH  and cord blood  were obtained.  Placenta was then manually removed from the  uterine cavity intact.  The endometrial surface was thoroughly debrided with  a dry lap sponge and edges of the uterine incision were grasped with ring  forceps.  Uterus was closed in two layers, first layer was closed with  continuous interlocking suture of #1 chromic and the second layer was closed  with continuous imbricating suture of #1 chromic.  Hemostasis was excellent.  Pelvic cavity was thoroughly irrigated with warm saline solution and all  clots are removed.  Closure of the uterus was again observed for hemostasis  and there was no active bleeding noted.  The abdomen was then closed as  follows:  The peritoneum was closed with continuous suture of 0 chromic, the  fascia was closed with continuous suture of 0 Vicryl, subcutaneous tissue  was thoroughly irrigated with warm saline solution and all areas of  subcutaneous bleeding were coagulated with Bovie.  Skin was then  approximated with continuous subcuticular suture of 3-0 Monocryl.  Sterile  bandages were applied to the incision closure.  Surgical technician  indicated that all sponge, needle and instrument counts were  correct x2.  The patient tolerated the procedure well and was transported to the recovery  room in satisfactory condition.                                               Charles A. Clearance Coots, M.D.    CAH/MEDQ  D:  10/24/2003  T:  10/25/2003  Job:  [id number]

## 2011-04-25 NOTE — Discharge Summary (Signed)
[name], [name]                          ACCOUNT NO.:  [id number]   MEDICAL RECORD NO.:  [id number]                   PATIENT TYPE:  INP   LOCATION:  9147                                 FACILITY:  [facility]   PHYSICIAN:  Roseanna Rainbow, M.D.         DATE OF BIRTH:  November 30, 1981   DATE OF ADMISSION:  10/23/2003  DATE OF DISCHARGE:  11/01/2003                                 DISCHARGE SUMMARY   CHIEF COMPLAINT:  The patient is a 30 year old, gravida 1, with an estimated  date of confinement of October 15, 2003, who presented for induction of  labor secondary to post dates.   HISTORY OF PRESENT ILLNESS:  As above. Prenatal care without any  complications or risks. GBS was positive.   PAST SURGICAL HISTORY:  She denies.   PAST MEDICAL HISTORY:  She denies.   MEDICATIONS:  Prenatal vitamins, Zyrtec, and iron.   ALLERGIES:  No known drug allergies.   SOCIAL HISTORY:  Single. She denies any tobacco, ethanol, or substance  abuse.   PHYSICAL EXAMINATION:  VITAL SIGNS: Stable and afebrile.  ABDOMEN: Gravid, nontender.  PELVIC EXAM: The cervix is long and closed with a vertex is long and closed  with vertex at a high station.   ASSESSMENT/PLAN:  Intrauterine pregnancy at 41 weeks for induction of labor.   HOSPITAL COURSE:  The patient was admitted and progressed slowly in labor.  However, the fetal heart tracings began to demonstrate severe repetitive  variable decelerations. At this point she was brought to the operating room  for Cesarean delivery. Please see the dictated operative summary for further  details.   On postoperative day #2, the patient complained of a productive cough.  On  postoperative day #3, she was noted to be report a greenish sputum. On  auscultation of her lungs rhonchi were noted. Chest x-ray was performed and  demonstrated changes consistent with bronchitis. She was started on  intravenous ampicillin. She also developed a fever with a T-max of 102  at  this point. Her antibiotic regimen was changed from ampicillin, Rocephin,  and Zithromax. Her white count remained normal at 7.7. Respiratory therapy  was requested. Repeat chest x-ray demonstrated no apparent disease. She  defervesced on the antibiotic regimen and was then discharged.   DISCHARGE DIAGNOSES:  1. Intrauterine pregnancy at 41 weeks.  2. Suspicious heart tracing with severe repetitive decelerations.  3. Postoperative bronchitis.   PROCEDURE:  Cesarean delivery.   CONDITION ON DISCHARGE:  Stable.   DIET:  Regular.   ACTIVITY:  No strenuous activity, pelvic rest.   MEDICATIONS:  Albuterol, Zithromax, Ceftin, and Percocet.   DISPOSITION:  The patient is to follow up in one week.  Roseanna Rainbow, M.D.    [name]  D:  11/27/2003  T:  11/27/2003  Job:  [id number]

## 2011-04-25 NOTE — Discharge Summary (Signed)
[name], DONALSON                ACCOUNT NO.:  [id number]   MEDICAL RECORD NO.:  [id number]          PATIENT TYPE:  INP   LOCATION:  9134                          FACILITY:  [facility]   PHYSICIAN:  Charles A. Clearance Coots, M.D.DATE OF BIRTH:  12/08/1981   DATE OF ADMISSION:  07/27/2008  DATE OF DISCHARGE:  07/30/2008                               DISCHARGE SUMMARY   ADMITTING DIAGNOSIS:  Previous cesarean section at term, desires repeat  cesarean section.   DISCHARGE DIAGNOSES:  1. Previous cesarean section at term, desires repeat cesarean section.  2. Status post repeat low transverse cesarean section on July 27, 2008, viable female was delivered at 1210, Apgars of 8 at 1 minute      and 9 at 5 minutes, weight of 8 pounds and 14 ounces.  Mother and      infant was discharged to home in good condition.   REASON FOR ADMISSION:  A 30 year old black female with a history of  previous cesarean section desired repeat cesarean section.  She is  gravida 3, para 1, estimated date of confinement of July 29, 2008.   PAST MEDICAL HISTORY:  Surgery:  Cesarean section.  Illnesses:  None.   MEDICATIONS:  Prenatal vitamins.   ALLERGIES:  No known drug allergies.   SOCIAL HISTORY:  Married.  Positive tobacco.  Negative alcohol or  recreational drug use.   FAMILY HISTORY:  Positive for diabetes, cardiovascular disease,  hypertension.   PHYSICAL EXAMINATION:  GENERAL:  Well-nourished, well-developed female  in no acute distress.  Afebrile.  VITAL SIGNS:  Stable.  LUNGS:  Clear to auscultation bilaterally.  HEART:  Regular rate and rhythm.  ABDOMEN:  Gravid, nontender.  PELVIC:  Cervical exam was omitted.   ADMITTING LABORATORY DATA:  Hemoglobin 12, hematocrit 39, white blood  cell count 8800, platelets 174,000.  RPR was nonreactive.   HOSPITAL COURSE:  The patient underwent repeat low transverse cesarean  section on July 27, 2008.  There were no intraoperative complications.  Postoperative course was uncomplicated.  The patient was discharged to  home on postop day #3 in good condition.   DISCHARGE LABORATORY DATA:  Hemoglobin 11, hematocrit 33, white blood  cell count 9100, platelets 129,000.   DISPOSITION:  Medications; Tylox and ibuprofen was prescribed for pain.  Continue prenatal vitamins.  Routine written instructions were given for  discharge after cesarean section.  The patient is to call our office for  a followup appointment in 2 weeks.      Charles A. Clearance Coots, M.D.  Electronically Signed     CAH/MEDQ  D:  08/11/2008  T:  08/12/2008  Job:  [id number]

## 2011-08-29 LAB — URINALYSIS, ROUTINE W REFLEX MICROSCOPIC
Nitrite: NEGATIVE
Specific Gravity, Urine: 1.015
pH: 6.5

## 2011-08-29 LAB — CBC
Hemoglobin: 11.5 — ABNORMAL LOW
Platelets: 235
RDW: 17.1 — ABNORMAL HIGH
WBC: 7.5

## 2011-08-29 LAB — WET PREP, GENITAL: Yeast Wet Prep HPF POC: NONE SEEN

## 2011-08-29 LAB — GC/CHLAMYDIA PROBE AMP, GENITAL: Chlamydia, DNA Probe: NEGATIVE

## 2011-08-29 LAB — DIFFERENTIAL
Basophils Absolute: 0.1
Lymphocytes Relative: 25
Lymphs Abs: 1.9
Monocytes Absolute: 0.5
Neutro Abs: 5

## 2011-09-03 LAB — POCT I-STAT, CHEM 8
BUN: 5 — ABNORMAL LOW
Calcium, Ion: 1.19
Chloride: 103
Creatinine, Ser: 0.8
Glucose, Bld: 78

## 2011-09-03 LAB — POCT URINALYSIS DIP (DEVICE)
Bilirubin Urine: NEGATIVE
Hgb urine dipstick: NEGATIVE
Ketones, ur: 15 — AB
Specific Gravity, Urine: 1.015
pH: 7.5

## 2011-09-05 LAB — CBC
HCT: 38.5
MCV: 85.3
Platelets: 171
RDW: 14.9
WBC: 8.4

## 2011-09-05 LAB — COMPREHENSIVE METABOLIC PANEL
Albumin: 2.5 — ABNORMAL LOW
BUN: 6
Chloride: 105
Creatinine, Ser: 0.62
Total Bilirubin: 0.2 — ABNORMAL LOW

## 2011-09-05 LAB — URINALYSIS, ROUTINE W REFLEX MICROSCOPIC
Bilirubin Urine: NEGATIVE
Glucose, UA: NEGATIVE
Hgb urine dipstick: NEGATIVE
Ketones, ur: NEGATIVE
Nitrite: NEGATIVE
Protein, ur: NEGATIVE
Specific Gravity, Urine: 1.02
Urobilinogen, UA: 0.2
pH: 6.5

## 2011-09-05 LAB — URIC ACID: Uric Acid, Serum: 4.2

## 2011-09-09 LAB — URINE CULTURE

## 2011-09-09 LAB — POCT URINALYSIS DIP (DEVICE)
Glucose, UA: NEGATIVE mg/dL
Operator id: 247071
Specific Gravity, Urine: 1.02 (ref 1.005–1.030)

## 2011-09-12 LAB — CBC
HCT: 36.2
MCHC: 32.8
MCV: 78.8
Platelets: 268
RBC: 4.59

## 2011-09-12 LAB — GC/CHLAMYDIA PROBE AMP, GENITAL
Chlamydia, DNA Probe: NEGATIVE
GC Probe Amp, Genital: NEGATIVE

## 2011-09-12 LAB — URINALYSIS, ROUTINE W REFLEX MICROSCOPIC
Bilirubin Urine: NEGATIVE
Glucose, UA: NEGATIVE
Hgb urine dipstick: NEGATIVE
Protein, ur: NEGATIVE
Urobilinogen, UA: 0.2

## 2011-09-12 LAB — DIFFERENTIAL
Basophils Relative: 1
Eosinophils Absolute: 0.2
Eosinophils Relative: 2
Monocytes Relative: 10
Neutrophils Relative %: 59

## 2011-09-12 LAB — WET PREP, GENITAL: Trich, Wet Prep: NONE SEEN

## 2011-09-18 LAB — CBC
HCT: 38.5
Hemoglobin: 12.5
RDW: 16.2 — ABNORMAL HIGH
WBC: 8.2

## 2011-09-18 LAB — URINALYSIS, ROUTINE W REFLEX MICROSCOPIC
Bilirubin Urine: NEGATIVE
Glucose, UA: NEGATIVE
Hgb urine dipstick: NEGATIVE
Ketones, ur: NEGATIVE
Specific Gravity, Urine: 1.017
pH: 6.5

## 2011-09-18 LAB — BASIC METABOLIC PANEL
Calcium: 8.6
GFR calc Af Amer: >60
GFR calc non Af Amer: >60
Glucose, Bld: 103 — ABNORMAL HIGH
Potassium: 3.8
Sodium: 135

## 2011-09-18 LAB — RAPID URINE DRUG SCREEN, HOSP PERFORMED
Barbiturates: NOT DETECTED
Benzodiazepines: NOT DETECTED
Cocaine: NOT DETECTED
Opiates: NOT DETECTED

## 2011-09-18 LAB — DIFFERENTIAL
Basophils Absolute: 0.1
Eosinophils Relative: 5
Lymphocytes Relative: 36
Lymphs Abs: 3
Monocytes Absolute: 0.4
Neutro Abs: 4.3

## 2011-09-18 LAB — PREGNANCY, URINE: Preg Test, Ur: NEGATIVE

## 2011-09-18 LAB — URINE MICROSCOPIC-ADD ON

## 2011-09-18 LAB — ETHANOL: Alcohol, Ethyl (B): <5

## 2011-09-19 LAB — I-STAT 8, (EC8 V) (CONVERTED LAB)
Acid-base deficit: 1
Chloride: 107
Glucose, Bld: 119 — ABNORMAL HIGH
Hemoglobin: 14.6
Potassium: 3.5
Sodium: 140
TCO2: 26

## 2011-09-19 LAB — ETHANOL: Alcohol, Ethyl (B): <5

## 2011-09-19 LAB — POCT I-STAT CREATININE: Operator id: 270111

## 2011-09-19 LAB — RAPID URINE DRUG SCREEN, HOSP PERFORMED
Amphetamines: NOT DETECTED
Tetrahydrocannabinol: POSITIVE — AB

## 2011-09-25 LAB — POCT URINALYSIS DIP (DEVICE)
Bilirubin Urine: NEGATIVE
Glucose, UA: NEGATIVE
Nitrite: POSITIVE — AB
Operator id: 239701
Protein, ur: >=300 — AB
Specific Gravity, Urine: >=1.03
Urobilinogen, UA: 0.2
pH: 6.5

## 2011-09-25 LAB — URINE CULTURE: Colony Count: >=100000

## 2011-09-25 LAB — POCT PREGNANCY, URINE
Operator id: 247071
Preg Test, Ur: NEGATIVE

## 2014-09-28 ENCOUNTER — Emergency Department (HOSPITAL_COMMUNITY)
Admission: EM | Admit: 2014-09-28 | Discharge: 2014-09-28 | Disposition: A | Discharge status: Home / Self Care | Payer: Self-pay | Attending: Emergency Medicine | Admitting: Emergency Medicine

## 2014-09-28 ENCOUNTER — Encounter (HOSPITAL_COMMUNITY): Payer: Self-pay | Admitting: Emergency Medicine

## 2014-09-28 DIAGNOSIS — Z720 Tobacco use: Secondary | ICD-10-CM | POA: Insufficient documentation

## 2014-09-28 DIAGNOSIS — R197 Diarrhea, unspecified: Secondary | ICD-10-CM

## 2014-09-28 DIAGNOSIS — R109 Unspecified abdominal pain: Secondary | ICD-10-CM | POA: Insufficient documentation

## 2014-09-28 DIAGNOSIS — R110 Nausea: Secondary | ICD-10-CM | POA: Insufficient documentation

## 2014-09-28 LAB — CBC WITH DIFFERENTIAL/PLATELET
Basophils Absolute: 0.1 10*3/uL (ref 0.0–0.1)
Basophils Relative: 2 % — ABNORMAL HIGH (ref 0–1)
EOS PCT: 4 % (ref 0–5)
Eosinophils Absolute: 0.2 10*3/uL (ref 0.0–0.7)
HCT: 30.5 % — ABNORMAL LOW (ref 36.0–46.0)
Hemoglobin: 8.9 g/dL — ABNORMAL LOW (ref 12.0–15.0)
LYMPHS ABS: 1.8 10*3/uL (ref 0.7–4.0)
Lymphocytes Relative: 40 % (ref 12–46)
MCH: 19.3 pg — AB (ref 26.0–34.0)
MCHC: 29.2 g/dL — ABNORMAL LOW (ref 30.0–36.0)
MCV: 66.2 fL — AB (ref 78.0–100.0)
MONO ABS: 0.4 10*3/uL (ref 0.1–1.0)
Monocytes Relative: 9 % (ref 3–12)
NEUTROS ABS: 2 10*3/uL (ref 1.7–7.7)
Neutrophils Relative %: 45 % (ref 43–77)
PLATELETS: 245 10*3/uL (ref 150–400)
RBC: 4.61 MIL/uL (ref 3.87–5.11)
RDW: 18.3 % — AB (ref 11.5–15.5)
WBC: 4.5 10*3/uL (ref 4.0–10.5)

## 2014-09-28 LAB — URINALYSIS, ROUTINE W REFLEX MICROSCOPIC
Bilirubin Urine: NEGATIVE
Glucose, UA: NEGATIVE mg/dL
Hgb urine dipstick: NEGATIVE
KETONES UR: NEGATIVE mg/dL
Leukocytes, UA: NEGATIVE
NITRITE: NEGATIVE
PROTEIN: NEGATIVE mg/dL
Specific Gravity, Urine: 1.005 (ref 1.005–1.030)
UROBILINOGEN UA: 0.2 mg/dL (ref 0.0–1.0)
pH: 6.5 (ref 5.0–8.0)

## 2014-09-28 LAB — COMPREHENSIVE METABOLIC PANEL
ALT: 13 U/L (ref 0–35)
ANION GAP: 10 (ref 5–15)
AST: 14 U/L (ref 0–37)
Albumin: 3.5 g/dL (ref 3.5–5.2)
Alkaline Phosphatase: 77 U/L (ref 39–117)
BUN: 9 mg/dL (ref 6–23)
CALCIUM: 8.9 mg/dL (ref 8.4–10.5)
CO2: 25 mEq/L (ref 19–32)
Chloride: 105 mEq/L (ref 96–112)
Creatinine, Ser: 0.84 mg/dL (ref 0.50–1.10)
GFR calc Af Amer: >90 mL/min (ref >90)
Glucose, Bld: 86 mg/dL (ref 70–99)
Potassium: 3.6 mEq/L — ABNORMAL LOW (ref 3.7–5.3)
Sodium: 140 mEq/L (ref 137–147)
Total Bilirubin: 0.3 mg/dL (ref 0.3–1.2)
Total Protein: 7.5 g/dL (ref 6.0–8.3)

## 2014-09-28 LAB — LIPASE, BLOOD: Lipase: 22 U/L (ref 11–59)

## 2014-09-28 MED ORDER — ONDANSETRON HCL 4 MG PO TABS: 4.0000 mg | ORAL_TABLET | Freq: Four times a day (QID) | ORAL | Status: DC

## 2014-09-28 NOTE — Discharge Instructions (Signed)

## 2014-09-28 NOTE — ED Notes (Addendum)
Pt was awoken from sleep with nausea and abdominal pain.  She had several bouts of diarrhea which have since stopped.  Denies emesis, though c/o nausea.

## 2014-09-28 NOTE — ED Provider Notes (Signed)
CSN: [id number]     Arrival date & time 09/28/14  1147 History   First MD Initiated Contact with Patient 09/28/14 1415     Chief Complaint  Patient presents with  . Diarrhea    accompanied by emesis     (Consider location/radiation/quality/duration/timing/severity/associated sxs/prior Treatment) Patient is a 33 y.o. female presenting with diarrhea. The history is provided by the patient. No language interpreter was used.  Diarrhea Quality:  Watery Onset quality:  Gradual Associated symptoms: abdominal pain   Associated symptoms: no chills, no fever, no myalgias and no vomiting   Associated symptoms comment:  She complains of nausea and non-bloody diarrhea through the night associated with abdominal cramping. No fever. No emesis. She states that since arrival to the ED her symptoms have resolved and she feels well. No lightheadedness, syncope or near syncope.    History reviewed. No pertinent past medical history. Past Surgical History  Procedure Laterality Date  . Cesarean section     No family history on file. History  Substance Use Topics  . Smoking status: Current Every Day Smoker -- 0.50 packs/day    Types: Cigarettes  . Smokeless tobacco: Not on file  . Alcohol Use: No   OB History   Grav Para Term Preterm Abortions TAB SAB Ect Mult Living                 Review of Systems  Constitutional: Negative for fever and chills.  HENT: Negative.   Respiratory: Negative.   Cardiovascular: Negative.   Gastrointestinal: Positive for nausea, abdominal pain and diarrhea. Negative for vomiting.  Genitourinary: Negative.   Musculoskeletal: Negative.  Negative for myalgias.  Skin: Negative.   Neurological: Negative.       Allergies  Doxycycline  Home Medications   Prior to Admission medications   Not on File   BP 106/66  Pulse 86  Temp(Src) 98.6 F (37 C) (Oral)  Resp 16  Ht 5\' 9"  (1.753 m)  Wt 300 lb (136.079 kg)  BMI 44.28 kg/m2  SpO2 100%  LMP  09/21/2014 Physical Exam  Constitutional: She is oriented to person, place, and time. She appears well-developed and well-nourished.  HENT:  Head: Normocephalic.  Neck: Normal range of motion. Neck supple.  Cardiovascular: Normal rate and regular rhythm.   Pulmonary/Chest: Effort normal and breath sounds normal.  Abdominal: Soft. Bowel sounds are normal. There is no tenderness. There is no rebound and no guarding.  Musculoskeletal: Normal range of motion.  Neurological: She is alert and oriented to person, place, and time.  Skin: Skin is warm and dry. No rash noted.  Psychiatric: She has a normal mood and affect.    ED Course  Procedures (including critical care time) Labs Review Labs Reviewed  CBC WITH DIFFERENTIAL - Abnormal; Notable for the following:    Hemoglobin 8.9 (*)    HCT 30.5 (*)    MCV 66.2 (*)    MCH 19.3 (*)    MCHC 29.2 (*)    RDW 18.3 (*)    Basophils Relative 2 (*)    All other components within normal limits  COMPREHENSIVE METABOLIC PANEL - Abnormal; Notable for the following:    Potassium 3.6 (*)    All other components within normal limits  LIPASE, BLOOD  URINALYSIS, ROUTINE W REFLEX MICROSCOPIC    Imaging Review No results found.   EKG Interpretation None      MDM   Final diagnoses:  None    1. Diarrhea, resolved 2. Anemia  She reports history of heavy menses and having just finished her cycle 2 days ago. Her Hgb is 8.9 with low indices as well, suggesting chronic nature of anemia found today on lab studies. VSS, no symptoms of lightheadedness or near syncope.   Symptoms of diarrhea and abdominal cramping have completely resolved. Abdomen is non-tender. Feel she is stable for discharge home.    Arnoldo HookerShari A Wyolene Weimann, PA-C 09/28/14 1449

## 2014-09-29 NOTE — ED Provider Notes (Signed)
Medical screening examination/treatment/procedure(s) were performed by non-physician practitioner and as supervising physician I was immediately available for consultation/collaboration.   EKG Interpretation None        Toy CookeyMegan Lacye Mccarn, MD 09/29/14 1708

## 2016-05-19 ENCOUNTER — Encounter (HOSPITAL_COMMUNITY): Payer: Self-pay | Admitting: Emergency Medicine

## 2016-05-19 ENCOUNTER — Emergency Department (HOSPITAL_COMMUNITY)
Admission: EM | Admit: 2016-05-19 | Discharge: 2016-05-19 | Disposition: A | Discharge status: Home / Self Care | Payer: Self-pay | Attending: Emergency Medicine | Admitting: Emergency Medicine

## 2016-05-19 DIAGNOSIS — S29012A Strain of muscle and tendon of back wall of thorax, initial encounter: Secondary | ICD-10-CM | POA: Insufficient documentation

## 2016-05-19 DIAGNOSIS — W01190A Fall on same level from slipping, tripping and stumbling with subsequent striking against furniture, initial encounter: Secondary | ICD-10-CM | POA: Insufficient documentation

## 2016-05-19 DIAGNOSIS — F17210 Nicotine dependence, cigarettes, uncomplicated: Secondary | ICD-10-CM | POA: Insufficient documentation

## 2016-05-19 DIAGNOSIS — Y999 Unspecified external cause status: Secondary | ICD-10-CM | POA: Insufficient documentation

## 2016-05-19 DIAGNOSIS — Y929 Unspecified place or not applicable: Secondary | ICD-10-CM | POA: Insufficient documentation

## 2016-05-19 DIAGNOSIS — T148XXA Other injury of unspecified body region, initial encounter: Secondary | ICD-10-CM

## 2016-05-19 DIAGNOSIS — Y939 Activity, unspecified: Secondary | ICD-10-CM | POA: Insufficient documentation

## 2016-05-19 HISTORY — DX: Obesity, unspecified: E66.9

## 2016-05-19 MED ORDER — CYCLOBENZAPRINE HCL 10 MG PO TABS: 10.0000 mg | ORAL_TABLET | Freq: Two times a day (BID) | ORAL | Status: DC | PRN

## 2016-05-19 NOTE — ED Provider Notes (Signed)
CSN: [id number]     Arrival date & time 05/19/16  [id number] History   First MD Initiated Contact with Patient 05/19/16 [phone]     Chief Complaint  Patient presents with  . Back Pain     (Consider location/radiation/quality/duration/timing/severity/associated sxs/prior Treatment) HPI Comments: Pt states that she fell from a seating position and hit a table with her her left upper back. Denies loc. No numbness or weakness. States that the area is sore. Hasn't taken anything for the symptoms. Denies any problems with movement. No previous injury  The history is provided by the patient. No language interpreter was used.    Past Medical History  Diagnosis Date  . Obesity    Past Surgical History  Procedure Laterality Date  . Cesarean section     No family history on file. Social History  Substance Use Topics  . Smoking status: Current Every Day Smoker -- 0.00 packs/day    Types: Cigarettes  . Smokeless tobacco: None  . Alcohol Use: No   OB History    No data available     Review of Systems  All other systems reviewed and are negative.     Allergies  Doxycycline  Home Medications   Prior to Admission medications   Medication Sig Start Date End Date Taking? Authorizing Provider  ondansetron (ZOFRAN) 4 MG tablet Take 1 tablet (4 mg total) by mouth every 6 (six) hours. 09/28/14   Shari Upstill, PA-C   BP 115/76 mmHg  Pulse 79  Temp(Src) 98.7 F (37.1 C) (Oral)  Resp 16  Ht 5' 9.5" (1.765 m)  Wt 133.811 kg  BMI 42.95 kg/m2  SpO2 100%  LMP 05/05/2016 (Approximate) Physical Exam  Constitutional: She appears well-developed and well-nourished.  HENT:  Head: Normocephalic and atraumatic.  Cardiovascular: Normal rate and regular rhythm.   Pulmonary/Chest: Effort normal and breath sounds normal.  Musculoskeletal: Normal range of motion.       Arms: Neurological: Coordination normal.  Skin: Skin is warm and dry.  Psychiatric: She has a normal mood and affect.  Nursing  note and vitals reviewed.   ED Course  Procedures (including critical care time) Labs Review Labs Reviewed - No data to display  Imaging Review No results found. I have personally reviewed and evaluated these images and lab results as part of my medical decision-making.   EKG Interpretation None      MDM   Final diagnoses:  Muscle strain   Paraspinal tenderness. Pt is neurologically intact.  Will treat with flexeril. Discussed follow up and return precautions    Teressa LowerVrinda Eliz Nigg, NP 05/19/16 [phone]  Mancel BaleElliott Wentz, MD 05/19/16 [phone]

## 2016-05-19 NOTE — ED Notes (Signed)
Declined W/C at D/C and was escorted to lobby by RN. 

## 2016-05-19 NOTE — ED Notes (Signed)
Pt. injured her left upper back last night , accidentally hit at against a table when she fell , denies LOC , ambulatory / respirations unlabored.

## 2017-07-08 ENCOUNTER — Emergency Department (HOSPITAL_COMMUNITY)
Admission: EM | Admit: 2017-07-08 | Discharge: 2017-07-08 | Disposition: A | Discharge status: Home / Self Care | Payer: Self-pay | Attending: Emergency Medicine | Admitting: Emergency Medicine

## 2017-07-08 ENCOUNTER — Encounter (HOSPITAL_COMMUNITY): Payer: Self-pay | Admitting: *Deleted

## 2017-07-08 DIAGNOSIS — F17210 Nicotine dependence, cigarettes, uncomplicated: Secondary | ICD-10-CM | POA: Insufficient documentation

## 2017-07-08 DIAGNOSIS — J029 Acute pharyngitis, unspecified: Secondary | ICD-10-CM

## 2017-07-08 DIAGNOSIS — I10 Essential (primary) hypertension: Secondary | ICD-10-CM | POA: Insufficient documentation

## 2017-07-08 DIAGNOSIS — J039 Acute tonsillitis, unspecified: Secondary | ICD-10-CM | POA: Insufficient documentation

## 2017-07-08 LAB — RAPID STREP SCREEN (MED CTR MEBANE ONLY): Streptococcus, Group A Screen (Direct): NEGATIVE

## 2017-07-08 MED ORDER — DEXAMETHASONE SODIUM PHOSPHATE 10 MG/ML IJ SOLN
10.0000 mg | Freq: Once | INTRAMUSCULAR | Status: AC
  Administered 2017-07-08: 10 mg via INTRAMUSCULAR
  Filled 2017-07-08: qty 1

## 2017-07-08 MED ORDER — ACETAMINOPHEN 500 MG PO TABS
1000.0000 mg | ORAL_TABLET | Freq: Once | ORAL | Status: AC
  Administered 2017-07-08: 1000 mg via ORAL
  Filled 2017-07-08: qty 2

## 2017-07-08 MED ORDER — AMOXICILLIN 500 MG PO CAPS: 500.0000 mg | ORAL_CAPSULE | Freq: Two times a day (BID) | ORAL | 0 refills | Status: DC

## 2017-07-08 NOTE — ED Triage Notes (Signed)
Pt reports having a sore throat since last night. Denies fever. Airway intact at triage.

## 2017-07-08 NOTE — Discharge Instructions (Signed)
You can take Tylenol or Ibuprofen as directed for pain. You can alternate Tylenol and Ibuprofen every 4 hours for additional pain relief.   Make sure you are drinking plenty of fluids and staying hydrated.   Take antibiotics as directed. Please take all of your antibiotics until finished.  Follow-up with your primary care doctor in 2 days. Call their office and let them know you were seen in the ED.   Return to the Emergency Department for any worsening pain, fever, difficulty swallowing, difficulty breathing, difficulty eating, neck or facial swelling or any other worsening or concerning symptoms.

## 2017-07-08 NOTE — ED Provider Notes (Signed)
[facility]-EMERGENCY DEPT Provider Note   CSN: [id number] Arrival date & time: 07/08/17  [id number]     History   Chief Complaint Chief Complaint  Patient presents with  . Sore Throat    HPI [name] is a 36 y.o. female who presents with sore throat that began last night. Patient states that she has not taken any medications for the pain. She reports that her pain is worsened with swallowing but she is able to tolerate her secretions and PO. She denies any difficulty eating or drinking. Patient reports subjective fever/chills last night. She denies any voice changes. Patient denies any cough, difficulty breathing, neck or facial swelling. No known sick contacts.  The history is provided by the patient.    Past Medical History:  Diagnosis Date  . Obesity     Patient Active Problem List   Diagnosis Date Noted  . HYPERLIPIDEMIA 06/07/2007  . DEPRESSION 06/07/2007  . HYPERTENSION 06/07/2007  . LOW BACK PAIN 06/07/2007    Past Surgical History:  Procedure Laterality Date  . CESAREAN SECTION      OB History    No data available       Home Medications    Prior to Admission medications   Medication Sig Start Date End Date Taking? Authorizing Provider  amoxicillin (AMOXIL) 500 MG capsule Take 1 capsule (500 mg total) by mouth 2 (two) times daily. 07/08/17   Maxwell CaulLayden, Madisson Kulaga A, PA-C  cyclobenzaprine (FLEXERIL) 10 MG tablet Take 1 tablet (10 mg total) by mouth 2 (two) times daily as needed for muscle spasms. 05/19/16   Teressa LowerPickering, Vrinda, NP  ondansetron (ZOFRAN) 4 MG tablet Take 1 tablet (4 mg total) by mouth every 6 (six) hours. 09/28/14   Elpidio AnisUpstill, Shari, PA-C    Family History History reviewed. No pertinent family history.  Social History Social History  Substance Use Topics  . Smoking status: Current Every Day Smoker    Packs/day: 0.00    Types: Cigarettes  . Smokeless tobacco: Not on file  . Alcohol use No     Allergies   Doxycycline   Review of Systems Review  of Systems  Constitutional: Positive for fever (subjective).  HENT: Positive for sore throat. Negative for drooling, facial swelling, trouble swallowing and voice change.   Respiratory: Negative for shortness of breath.      Physical Exam Updated Vital Signs BP 121/72 (BP Location: Left Arm)   Pulse 77   Temp 99.4 F (37.4 C) (Oral)   Resp 18   LMP 06/24/2017   SpO2 100%   Physical Exam  Constitutional: She appears well-developed and well-nourished.  Sitting comfortably on examination table  HENT:  Head: Normocephalic and atraumatic.  Mouth/Throat: Uvula is midline and mucous membranes are normal. No trismus in the jaw. Posterior oropharyngeal erythema present.  Moderate bilateral tonsillar edema and exudates. Uvula is midline. No evidence of peritonsillar abscess. No trismus. No neck or facial swelling   Eyes: Conjunctivae and EOM are normal. Right eye exhibits no discharge. Left eye exhibits no discharge. No scleral icterus.  Pulmonary/Chest: Effort normal and breath sounds normal. She has no decreased breath sounds. She has no wheezes.  Airway is patent. No evidence of respiratory distress. Able to speak in full sentences without difficulty.  Lymphadenopathy:    She has no cervical adenopathy.  Neurological: She is alert.  Skin: Skin is warm and dry.  Psychiatric: She has a normal mood and affect. Her speech is normal and behavior is normal.  Nursing note  and vitals reviewed.    ED Treatments / Results  Labs (all labs ordered are listed, but only abnormal results are displayed) Labs Reviewed  RAPID STREP SCREEN (NOT AT [facility])  CULTURE, GROUP A STREP [facility])    EKG  EKG Interpretation None       Radiology No results found.  Procedures Procedures (including critical care time)  Medications Ordered in ED Medications  dexamethasone (DECADRON) injection 10 mg (not administered)  acetaminophen (TYLENOL) tablet 1,000 mg (not administered)     Initial  Impression / Assessment and Plan / ED Course  I have reviewed the triage vital signs and the nursing notes.  Pertinent labs & imaging results that were available during my care of the patient were reviewed by me and considered in my medical decision making (see chart for details).     36 y.o. F who presents with sore throat that began last night. Patient is afebrile, non-toxic appearing, sitting comfortably on examination table. Vital signs reviewed and stable. Consider tonsillitis vs pharyngitis. Rapid strep ordered at bedside. History/physical exam are not concerning for Ludwig Angina or peritonsillar abscess. Plan to give analgesics and decadron in the department to help with tonsillar edema.   Rapid strep is negative. Discussed results with patient. Symptomatic care discussed. Given patient's bilateral tonsillar edema, exudates and erythema, will plan to treat. Patient able to drink water without any difficulty in the department.  Provided patient with a list of clinic resources to use if he does not have a PCP. Instructed to call them today to arrange follow-up in the next 24-48 hours. Strict return precautions discussed. Patient expresses understanding and agreement to plan.    Final Clinical Impressions(s) / ED Diagnoses   Final diagnoses:  Tonsillitis  Pharyngitis, unspecified etiology    New Prescriptions New Prescriptions   AMOXICILLIN (AMOXIL) 500 MG CAPSULE    Take 1 capsule (500 mg total) by mouth 2 (two) times daily.     Maxwell CaulLayden, Kylar Leonhardt A, PA-C 07/08/17 1058    Doug SouJacubowitz, Sam, MD 07/08/17 [phone]

## 2017-07-10 LAB — CULTURE, GROUP A STREP (THRC)

## 2019-09-10 ENCOUNTER — Ambulatory Visit (HOSPITAL_COMMUNITY)
Admission: EM | Admit: 2019-09-10 | Discharge: 2019-09-10 | Disposition: A | Discharge status: Home / Self Care | Payer: Self-pay | Attending: Family Medicine | Admitting: Family Medicine

## 2019-09-10 ENCOUNTER — Other Ambulatory Visit: Payer: Self-pay

## 2019-09-10 ENCOUNTER — Encounter (HOSPITAL_COMMUNITY): Payer: Self-pay

## 2019-09-10 DIAGNOSIS — Z6841 Body Mass Index (BMI) 40.0 and over, adult: Secondary | ICD-10-CM | POA: Insufficient documentation

## 2019-09-10 DIAGNOSIS — Z20828 Contact with and (suspected) exposure to other viral communicable diseases: Secondary | ICD-10-CM | POA: Insufficient documentation

## 2019-09-10 DIAGNOSIS — J40 Bronchitis, not specified as acute or chronic: Secondary | ICD-10-CM | POA: Insufficient documentation

## 2019-09-10 DIAGNOSIS — E669 Obesity, unspecified: Secondary | ICD-10-CM | POA: Insufficient documentation

## 2019-09-10 MED ORDER — PREDNISONE 20 MG PO TABS
40.0000 mg | ORAL_TABLET | Freq: Every day | ORAL | 0 refills | Status: AC
Start: 2019-09-10 — End: 2019-09-15

## 2019-09-10 NOTE — ED Provider Notes (Signed)
  [place]    CSN: [id number] Arrival date & time: 09/10/19  1222  Chief Complaint  Patient presents with  . Cough    Kandee Keen here for URI complaints.  Duration: 4 days  Associated symptoms: wheezing, shortness of breath and cough Denies: sinus congestion, sinus pain, rhinorrhea, itchy watery eyes, ear pain, ear drainage, sore throat, myalgia and fevers Treatment to date: Tylenol, Robitussin Sick contacts: No  ROS:  Const: Denies fevers HEENT: As noted in HPI Lungs: +cough  Past Medical History:  Diagnosis Date  . Obesity     BP 139/80 (BP Location: Right Arm)   Pulse 78   Temp 97.6 F (36.4 C) (Oral)   Resp 18   Wt 127 kg   LMP 09/10/2019   SpO2 100%   BMI 40.76 kg/m  General: Awake, alert, appears stated age 38: AT, [place], ears patent b/l and TM's neg, nares patent w/o discharge, pharynx pink and without exudates, MMM Neck: No masses or asymmetry Heart: RRR Lungs: +diffuse wheezing, no accessory muscle use Psych: Age appropriate judgment and insight, normal mood and affect    Final Clinical Impressions(s) / UC Diagnoses   Final diagnoses:  Wheezy bronchitis   Pt had tubal ligation.    Discharge Instructions     Continue to push fluids, practice good hand hygiene, and cover your mouth if you cough.  If you start having fevers, shaking or shortness of breath, seek immediate care.  Let us know if you need anything.     ED Prescriptions    Medication Sig Dispense Auth. Provider   predniSONE (DELTASONE) 20 MG tablet Take 2 tablets (40 mg total) by mouth daily with breakfast for 5 days. 10 tablet Shelda Pal, DO        Riki Sheer [place], [place] 09/10/19 1331

## 2019-09-10 NOTE — ED Triage Notes (Signed)
Pt states she has cough, wheezing and congestion. X 4 days.

## 2019-09-10 NOTE — Discharge Instructions (Signed)
Continue to push fluids, practice good hand hygiene, and cover your mouth if you cough.  If you start having fevers, shaking or shortness of breath, seek immediate care.  Let us know if you need anything.  

## 2019-09-11 LAB — NOVEL CORONAVIRUS, NAA (HOSP ORDER, SEND-OUT TO REF LAB; TAT 18-24 HRS): SARS-CoV-2, NAA: NOT DETECTED

## 2019-09-12 ENCOUNTER — Encounter (HOSPITAL_COMMUNITY): Payer: Self-pay

## 2019-09-22 ENCOUNTER — Other Ambulatory Visit: Payer: Self-pay

## 2019-09-22 DIAGNOSIS — Z20822 Contact with and (suspected) exposure to covid-19: Secondary | ICD-10-CM

## 2019-09-24 LAB — NOVEL CORONAVIRUS, NAA: SARS-CoV-2, NAA: NOT DETECTED

## 2020-07-30 ENCOUNTER — Encounter (HOSPITAL_COMMUNITY): Payer: Self-pay

## 2020-07-30 ENCOUNTER — Ambulatory Visit (HOSPITAL_COMMUNITY)
Admission: EM | Admit: 2020-07-30 | Discharge: 2020-07-30 | Disposition: A | Discharge status: Home / Self Care | Payer: 59 | Attending: Physician Assistant | Admitting: Physician Assistant

## 2020-07-30 ENCOUNTER — Other Ambulatory Visit: Payer: Self-pay

## 2020-07-30 DIAGNOSIS — M25562 Pain in left knee: Secondary | ICD-10-CM

## 2020-07-30 DIAGNOSIS — G8929 Other chronic pain: Secondary | ICD-10-CM | POA: Diagnosis not present

## 2020-07-30 MED ORDER — MELOXICAM 15 MG PO TABS: 15.0000 mg | ORAL_TABLET | Freq: Every day | ORAL | 0 refills | Status: AC

## 2020-07-30 MED ORDER — DICLOFENAC SODIUM 1 % EX GEL: 4.0000 g | Freq: Four times a day (QID) | CUTANEOUS | 0 refills | Status: DC

## 2020-07-30 MED ORDER — ACETAMINOPHEN 325 MG PO TABS: 650.0000 mg | ORAL_TABLET | Freq: Four times a day (QID) | ORAL | 0 refills | Status: DC | PRN

## 2020-07-30 NOTE — Discharge Instructions (Signed)
Take medications as prescribed  Ice knee at end of shifts  Consider a knee sleeve if they fit properly  Call the sports medicine group for follow up

## 2020-07-30 NOTE — ED Triage Notes (Signed)
Pt presents with left side achy, throbbing knee pain X 3 days.

## 2020-07-30 NOTE — ED Provider Notes (Signed)
[facility]    CSN: [id number] Arrival date & time: 07/30/20  1413      History   Chief Complaint Chief Complaint  Patient presents with   Knee Pain    HPI [name] is a 39 y.o. female.   Patient presents for left knee pain.  She reports she has had pain in left knee on and off for quite some time.  She reports recent increase in pain.  She reports pain is primarily in the front of the knee.  Worse with going up and down stairs.  She reports an achy throbbing feeling.  No falls or traumas.  No pops.  Occasionally feels as though knee might give out.  Denies fever and chills.  Denies significant swelling.  Patient has been trying ibuprofen twice a day with some relief.     Past Medical History:  Diagnosis Date   Obesity     Patient Active Problem List   Diagnosis Date Noted   HYPERLIPIDEMIA 06/07/2007   DEPRESSION 06/07/2007   HYPERTENSION 06/07/2007   LOW BACK PAIN 06/07/2007    Past Surgical History:  Procedure Laterality Date   CESAREAN SECTION      OB History   No obstetric history on file.      Home Medications    Prior to Admission medications   Medication Sig Start Date End Date Taking? Authorizing Provider  acetaminophen (TYLENOL) 325 MG tablet Take 2 tablets (650 mg total) by mouth every 6 (six) hours as needed. 07/30/20   Montrelle Eddings, Veryl Speak, PA-C  diclofenac Sodium (VOLTAREN) 1 % GEL Apply 4 g topically 4 (four) times daily. 07/30/20   Michah Minton, Veryl Speak, PA-C  meloxicam (MOBIC) 15 MG tablet Take 1 tablet (15 mg total) by mouth daily for 10 days. 07/30/20 08/09/20  Pavlos Yon, Veryl Speak, PA-C    Family History History reviewed. No pertinent family history.  Social History Social History   Tobacco Use   Smoking status: Current Every Day Smoker    Packs/day: 0.00    Types: Cigarettes  Substance Use Topics   Alcohol use: No   Drug use: Not on file     Allergies   Doxycycline   Review of Systems Review of Systems   Physical  Exam Triage Vital Signs ED Triage Vitals  Enc Vitals Group     BP 07/30/20 1533 (!) 147/91     Pulse Rate 07/30/20 1533 64     Resp 07/30/20 1533 18     Temp 07/30/20 1533 98.6 F (37 C)     Temp Source 07/30/20 1533 Oral     SpO2 07/30/20 1533 100 %     Weight --      Height --      Head Circumference --      Peak Flow --      Pain Score 07/30/20 1532 8     Pain Loc --      Pain Edu? --      Excl. in GC? --    No data found.  Updated Vital Signs BP (!) 147/91 (BP Location: Right Arm)    Pulse 64    Temp 98.6 F (37 C) (Oral)    Resp 18    LMP 07/17/2020    SpO2 100%   Visual Acuity Right Eye Distance:   Left Eye Distance:   Bilateral Distance:    Right Eye Near:   Left Eye Near:    Bilateral Near:  Physical Exam Vitals and nursing note reviewed.  Constitutional:      Appearance: Normal appearance. She is obese.  Cardiovascular:     Rate and Rhythm: Normal rate.  Pulmonary:     Effort: Pulmonary effort is normal. No respiratory distress.  Musculoskeletal:     Comments: Left knee without swelling, erythema or deformity.  Joint is not hot.  Difficult to ascertain level of possible effusion due to body habitus.  Patient is able to bear weight with slight limp.  Patient has full range of motion of the left knee.  No bony tenderness.  Patella is freely mobile.  No crepitus on manipulation.  Stable to anterior and posterior drawer.  Distal pulses 2+.  Sensation intact.  Neurological:     Mental Status: She is alert.      UC Treatments / Results  Labs (all labs ordered are listed, but only abnormal results are displayed) Labs Reviewed - No data to display  EKG   Radiology No results found.  Procedures Procedures (including critical care time)  Medications Ordered in UC Medications - No data to display  Initial Impression / Assessment and Plan / UC Course  I have reviewed the triage vital signs and the nursing notes.  Pertinent labs & imaging results  that were available during my care of the patient were reviewed by me and considered in my medical decision making (see chart for details).     #Chronic knee pain Patient is a 39 year old presenting with chronic knee pain.  No acute indications for imaging.  Differential would include chronic changes versus chronic meniscal injuries.  Patient has stable gait.  Will trial Mobic, topical Voltaren.  Encourage use of knee sleeve if she finds this useful.  We will have her follow-up with sports medicine.  Discussed RICE.  Patient verbalized understanding plan of care. Final Clinical Impressions(s) / UC Diagnoses   Final diagnoses:  Chronic pain of left knee     Discharge Instructions     Take medications as prescribed  Ice knee at end of shifts  Consider a knee sleeve if they fit properly  Call the sports medicine group for follow up      ED Prescriptions    Medication Sig Dispense Auth. Provider   meloxicam (MOBIC) 15 MG tablet Take 1 tablet (15 mg total) by mouth daily for 10 days. 10 tablet Adelynn Gipe, Veryl Speak, PA-C   diclofenac Sodium (VOLTAREN) 1 % GEL Apply 4 g topically 4 (four) times daily. 150 g Audryana Hockenberry, Veryl Speak, PA-C   acetaminophen (TYLENOL) 325 MG tablet Take 2 tablets (650 mg total) by mouth every 6 (six) hours as needed. 30 tablet Shem Plemmons, Veryl Speak, PA-C     PDMP not reviewed this encounter.   Hermelinda Medicus, PA-C 07/31/20 [phone]

## 2020-08-06 ENCOUNTER — Other Ambulatory Visit: Payer: Self-pay

## 2020-08-06 ENCOUNTER — Ambulatory Visit (INDEPENDENT_AMBULATORY_CARE_PROVIDER_SITE_OTHER): Payer: 59 | Admitting: Family Medicine

## 2020-08-06 VITALS — BP 138/86 | Ht 71.0 in | Wt 290.0 lb

## 2020-08-06 DIAGNOSIS — M222X2 Patellofemoral disorders, left knee: Secondary | ICD-10-CM | POA: Diagnosis not present

## 2020-08-06 NOTE — Patient Instructions (Signed)
You have patellofemoral syndrome. Avoid painful activities when possible (often deep squats, lunges bother this). Cross train with swimming, cycling with low resistance, elliptical if needed. Do home exercises as directed once a day. Add ankle weight if these become too easy. Consider formal physical therapy. Avoid flat shoes, barefoot walking as much as possible. Icing 15 minutes at a time 3-4 times a day as needed. Tylenol or ibuprofen as needed for pain. Follow up with me in 6 weeks.

## 2020-08-06 NOTE — Progress Notes (Signed)
Office Visit Note   Patient: [name]           Date of Birth: 1981/11/04           MRN: [id number] Visit Date: 08/06/2020 Requested by: No referring provider defined for this encounter. PCP: Patient, No Pcp Per  Subjective: CC: Left knee pain  HPI: 39 year old female presenting to clinic with acute on chronic left knee pain.  Patient denies any trauma to the affected area, but states that over the past few years she has noticed an increased "grinding" beneath her kneecap.  She says that she works at [employer], and is on her feet throughout the day which will occasionally cause an aching sensation in her left knee.  Pain is significantly worsened by squatting, going up and down stairs, or prolonged sitting.  She describes her pain as "deep beneath the kneecap."  She endorses a "popping" sensation in her left knee, though denies any mechanical catching of the knee.  She states that over the past few months her pain is gotten so bad it has become very uncomfortable for her to sit back to the toilet, and she has to go up and down stairs one leg at a time.  Denies swelling, bruising, or sensations of instability within the left knee.  She says that although this has been going on for several years, she has not yet sought treatment, hoping that it would go away on its own.  She endorses a similar popping sensation on her right kneecap, but states that this is nonpainful.               ROS:   All other systems were reviewed and are negative.  Objective: Vital Signs: BP 138/86   Ht 5\' 11"  (1.803 m)   Wt 290 lb (131.5 kg)   LMP 07/17/2020   BMI 40.45 kg/m   Physical Exam:  General:  Alert and oriented, in no acute distress. Pulm:  Breathing unlabored. Psy:  Normal mood, congruent affect. Skin: No swelling, bruising, or rashes of either knee.  Knee exam:  Normal gait.  Bilateral knees with mild valgus deformity.  Lateral patellar tilt.  No significant pes planus. Seated exam:  No  significant effusion appreciated. Mild tenderness to palpation with patellar compression, as well as with palpation of patella facets.  No medial or lateral joint line tenderness.  No tenderness over patellar tendon.  Significant patellar crepitus appreciated bilaterally with knee flexion and extension.  FROM Ligamentous testing:  Strong endpoints with no pain on anterior and posterior drawer.  No laxity or pain with varus or valgus stress across the knee.  Meniscal testing:  No pain or deep clicking appreciated with [place].  Special tests: Gluteal strength somewhat decreased with resisted leg abduction.  5 out of 5 strength with knee flexion and extension, as well as with ankle dorsiflexion and plantarflexion.   Sensation intact throughout bilateral lower extremities.  Imaging: No results found.  Assessment & Plan: 39 year old female presenting to clinic today with concerns of acute on chronic left knee pain.  Examination as above, with tenderness with left patellar compression, as well as with palpation of patellar facets, and patellar crepitus bilaterally.  -Pain most likely due to patellofemoral pain syndrome.  Examination with weakened glut medius on resisted abduction, as well as mild valgus deformity of knees.  -Discussed that mainstay of treatment is physical therapy for quad balancing, and knee strengthening.  -Patient is agreeable with physical therapy plan, would  like to try home PT to see if this will improve her symptoms.  -Educated with home exercises she can perform by athletic trainer in clinic, and provided with knee support brace to improve her comfort while on her feet at work. -Return to clinic if no improvement in 4 to 6 weeks of prescribed home exercises.    Procedures: No procedures performed  No notes on file

## 2020-08-07 ENCOUNTER — Encounter: Payer: Self-pay | Admitting: Family Medicine

## 2020-08-16 ENCOUNTER — Telehealth: Payer: Self-pay | Admitting: *Deleted

## 2020-08-16 MED ORDER — MELOXICAM 15 MG PO TABS: ORAL_TABLET | ORAL | 1 refills | Status: DC

## 2020-08-16 NOTE — Telephone Encounter (Signed)
Per Dr Pearletha Forge, we can try her on Mobic, 15mg  a day. Will have medication sent to her pharmacy.

## 2020-09-06 ENCOUNTER — Encounter (INDEPENDENT_AMBULATORY_CARE_PROVIDER_SITE_OTHER): Payer: Self-pay | Admitting: Primary Care

## 2020-09-06 ENCOUNTER — Other Ambulatory Visit: Payer: Self-pay

## 2020-09-06 ENCOUNTER — Telehealth (INDEPENDENT_AMBULATORY_CARE_PROVIDER_SITE_OTHER): Payer: 59 | Admitting: Primary Care

## 2020-09-06 DIAGNOSIS — Z114 Encounter for screening for human immunodeficiency virus [HIV]: Secondary | ICD-10-CM | POA: Diagnosis not present

## 2020-09-06 DIAGNOSIS — Z1159 Encounter for screening for other viral diseases: Secondary | ICD-10-CM | POA: Diagnosis not present

## 2020-09-06 DIAGNOSIS — M25562 Pain in left knee: Secondary | ICD-10-CM | POA: Diagnosis not present

## 2020-09-06 DIAGNOSIS — E6609 Other obesity due to excess calories: Secondary | ICD-10-CM | POA: Diagnosis not present

## 2020-09-06 DIAGNOSIS — Z7689 Persons encountering health services in other specified circumstances: Secondary | ICD-10-CM

## 2020-09-06 DIAGNOSIS — Z0000 Encounter for general adult medical examination without abnormal findings: Secondary | ICD-10-CM

## 2020-09-06 NOTE — Progress Notes (Signed)
Telephone Note  I connected with [name] on 09/06/20 at  1:50 PM EDT by telephone and verified that I am speaking with the correct person using two identifiers.  [name] is at home  I discussed the limitations, risks, security and privacy concerns of performing an evaluation and management service by telephone and the availability of in person appointments. I also discussed with the patient that there may be a patient responsible charge related to this service. The patient expressed understanding and agreed to proceed. [name] in the office [facility]  History of Present Illness: [name] is a 12 female she is establishing care and voices concerns about needing a brace support for her left knee.  She wanted to know how to give ONE.  She was seen in the emergency room on July 30, 2020 for left knee effusion'.   Past Medical History:  Diagnosis Date  . Obesity    Current Outpatient Medications on File Prior to Visit  Medication Sig Dispense Refill  . meloxicam (MOBIC) 15 MG tablet Take one pill a day with food for 7 days and then prn thereafter 30 tablet 1  . acetaminophen (TYLENOL) 325 MG tablet Take 2 tablets (650 mg total) by mouth every 6 (six) hours as needed. (Patient not taking: Reported on 09/06/2020) 30 tablet 0  . diclofenac Sodium (VOLTAREN) 1 % GEL Apply 4 g topically 4 (four) times daily. (Patient not taking: Reported on 09/06/2020) 150 g 0   No current facility-administered medications on file prior to visit.   Observations/Objective: Review of Systems  Musculoskeletal:       Knee pain  All other systems reviewed and are negative.  Assessment and Plan: [name] was seen today for new patient (initial visit) and medication refill.  Diagnoses and all orders for this visit:  Obesity due to excess calories without serious comorbidity, unspecified classification [name] was seen today for new patient (initial visit) and medication refill. -      Lipid panel; Future -     TSH + free T4; Future  Need for hepatitis C screening test Future order labs preventive care health maintenance and Care gap  Encounter for screening for HIV Future order labs preventive care health maintenance and Care gap  Acute pain of left knee Encourage patient to call orthopedics for an appointment where they can properly fit and measure knee for correct size shape of knee brace.  Encounter to establish care  Bridget Mire, NP-C will be your  (PCP) she is mastered prepared . She is skilled to diagnosed and treat illness. Also able to answer health concern as well as continuing care of varied medical conditions, not limited by cause, organ system, or diagnosis.   -     CBC with Differential/Platelet; Future -     CMP14+EGFR; Future  Healthcare maintenance Future preventative care labs  Follow Up Instructions:    I discussed the assessment and treatment plan with the patient. The patient was provided an opportunity to ask questions and all were answered. The patient agreed with the plan and demonstrated an understanding of the instructions.   The patient was advised to call back or seek an in-person evaluation if the symptoms worsen or if the condition fails to improve as anticipated.  I provided 28 minutes of non-face-to-face time during this encounter.   Bridget Perna, NP

## 2020-09-06 NOTE — Progress Notes (Signed)
Pt needs a brace or support band for the left knee sports medicine did not have one available in her size

## 2020-09-10 ENCOUNTER — Ambulatory Visit: Payer: 59 | Admitting: Sports Medicine

## 2020-09-17 ENCOUNTER — Ambulatory Visit: Payer: 59 | Admitting: Family Medicine

## 2020-09-18 ENCOUNTER — Ambulatory Visit: Payer: 59 | Admitting: Sports Medicine

## 2020-09-24 ENCOUNTER — Ambulatory Visit: Payer: 59 | Admitting: Family Medicine

## 2020-10-05 ENCOUNTER — Other Ambulatory Visit: Payer: Self-pay | Admitting: Family Medicine

## 2020-10-29 ENCOUNTER — Ambulatory Visit (HOSPITAL_COMMUNITY)
Admission: EM | Admit: 2020-10-29 | Discharge: 2020-10-29 | Disposition: A | Discharge status: Home / Self Care | Payer: 59 | Attending: Family Medicine | Admitting: Family Medicine

## 2020-10-29 ENCOUNTER — Encounter (HOSPITAL_COMMUNITY): Payer: Self-pay

## 2020-10-29 ENCOUNTER — Ambulatory Visit (INDEPENDENT_AMBULATORY_CARE_PROVIDER_SITE_OTHER): Payer: 59

## 2020-10-29 ENCOUNTER — Other Ambulatory Visit: Payer: Self-pay

## 2020-10-29 DIAGNOSIS — M1712 Unilateral primary osteoarthritis, left knee: Secondary | ICD-10-CM | POA: Diagnosis not present

## 2020-10-29 DIAGNOSIS — G8929 Other chronic pain: Secondary | ICD-10-CM | POA: Diagnosis not present

## 2020-10-29 DIAGNOSIS — M25562 Pain in left knee: Secondary | ICD-10-CM | POA: Diagnosis not present

## 2020-10-29 MED ORDER — MELOXICAM 7.5 MG PO TABS: 7.5000 mg | ORAL_TABLET | Freq: Every day | ORAL | 1 refills | Status: DC

## 2020-10-29 MED ORDER — PREDNISONE 10 MG (21) PO TBPK: ORAL_TABLET | ORAL | 0 refills | Status: DC

## 2020-10-29 NOTE — ED Triage Notes (Signed)
Pt presents with chronic left knee pain for past few months.

## 2020-10-29 NOTE — ED Provider Notes (Signed)
[facility]    CSN: [id number] Arrival date & time: 10/29/20  1011       History   Chief Complaint Chief Complaint  Patient presents with  . Knee Pain    HPI [name] is a 39 y.o. female.   Pt is a 39 year female that presents with chronic left knee pain. This has been an ongoing issue for a while. Was previously told chronic arthritis. Was on meloxicam at one point which seemed to help. No new injuries.  Reporting pain with ambulation and popping and cracking in the knee.  Pain is generalized.  There is mild swelling.     Past Medical History:  Diagnosis Date  . Obesity     Patient Active Problem List   Diagnosis Date Noted  . HYPERLIPIDEMIA 06/07/2007  . DEPRESSION 06/07/2007  . HYPERTENSION 06/07/2007  . LOW BACK PAIN 06/07/2007    Past Surgical History:  Procedure Laterality Date  . CESAREAN SECTION      OB History   No obstetric history on file.      Home Medications    Prior to Admission medications   Medication Sig Start Date End Date Taking? Authorizing Provider  meloxicam (MOBIC) 7.5 MG tablet Take 1 tablet (7.5 mg total) by mouth daily. 10/29/20   Dahlia Byes A, NP  predniSONE (STERAPRED UNI-PAK 21 TAB) 10 MG (21) TBPK tablet 6 tabs for 1 day, then 5 tabs for 1 das, then 4 tabs for 1 day, then 3 tabs for 1 day, 2 tabs for 1 day, then 1 tab for 1 day 10/29/20   Janace Aris, NP    Family History History reviewed. No pertinent family history.  Social History Social History   Tobacco Use  . Smoking status: Current Every Day Smoker    Packs/day: 0.00    Types: Cigarettes  . Smokeless tobacco: Never Used  Substance Use Topics  . Alcohol use: No  . Drug use: Not on file     Allergies   Doxycycline   Review of Systems Review of Systems   Physical Exam Triage Vital Signs ED Triage Vitals  Enc Vitals Group     BP 10/29/20 1044 130/77     Pulse Rate 10/29/20 1044 64     Resp 10/29/20 1044 18     Temp 10/29/20  1044 98 F (36.7 C)     Temp Source 10/29/20 1044 Oral     SpO2 10/29/20 1044 100 %     Weight --      Height --      Head Circumference --      Peak Flow --      Pain Score 10/29/20 1042 7     Pain Loc --      Pain Edu? --      Excl. in GC? --    No data found.  Updated Vital Signs BP 130/77 (BP Location: Right Arm)   Pulse 64   Temp 98 F (36.7 C) (Oral)   Resp 18   LMP 09/24/2020   SpO2 100%   Visual Acuity Right Eye Distance:   Left Eye Distance:   Bilateral Distance:    Right Eye Near:   Left Eye Near:    Bilateral Near:     Physical Exam Vitals and nursing note reviewed.  Constitutional:      General: She is not in acute distress.    Appearance: Normal appearance. She is obese. She is not ill-appearing,  toxic-appearing or diaphoretic.  HENT:     Head: Normocephalic.     Nose: Nose normal.  Eyes:     Conjunctiva/sclera: Conjunctivae normal.  Pulmonary:     Effort: Pulmonary effort is normal.  Musculoskeletal:     Cervical back: Normal range of motion.     Left knee: Swelling and bony tenderness present. Decreased range of motion. Tenderness present.  Skin:    General: Skin is warm and dry.     Findings: No rash.  Neurological:     Mental Status: She is alert.  Psychiatric:        Mood and Affect: Mood normal.      UC Treatments / Results  Labs (all labs ordered are listed, but only abnormal results are displayed) Labs Reviewed - No data to display  EKG   Radiology DG Knee Complete 4 Views Left  Result Date: 10/29/2020 CLINICAL DATA:  39 year old female with increasing chronic knee pain. EXAM: LEFT KNEE - COMPLETE 4+ VIEW COMPARISON:  None. FINDINGS: Normal background bone mineralization. Preserved alignment at the left knee. Mild lateral joint space loss and subchondral sclerosis. Patellofemoral joint space appears normal although there is mild patellofemoral spurring. Similarly, medial joint space preserved but with mild spurring. No joint  effusion. No acute osseous abnormality identified. IMPRESSION: 1. Tricompartmental degenerative changes with mild joint space loss in the lateral compartment. 2.  No acute osseous abnormality or joint effusion is evident. Electronically Signed   By: Odessa Fleming M.D.   On: 10/29/2020 11:40    Procedures Procedures (including critical care time)  Medications Ordered in UC Medications - No data to display  Initial Impression / Assessment and Plan / UC Course  I have reviewed the triage vital signs and the nursing notes.  Pertinent labs & imaging results that were available during my care of the patient were reviewed by me and considered in my medical decision making (see chart for details).     Chronic left knee pain. EKG with tricompartmental degenerative changes with mild joint space loss laterally. Most likely cause of her symptoms due to osteoarthritis. Will treat with prednisone taper over the next 6 days. She can then meloxicam as needed Ace wrap for compression Rest, Ice  Sports med as needed.  Final Clinical Impressions(s) / UC Diagnoses   Final diagnoses:  Osteoarthritis of left knee, unspecified osteoarthritis type     Discharge Instructions     You have osteoarthritis of the knee  Prednisone daily for 6 days.  Take this with food.  After you are done with the prednisone taper you can take the meloxicam as needed for pain. Ace wrap to help with compression      ED Prescriptions    Medication Sig Dispense Auth. Provider   predniSONE (STERAPRED UNI-PAK 21 TAB) 10 MG (21) TBPK tablet 6 tabs for 1 day, then 5 tabs for 1 das, then 4 tabs for 1 day, then 3 tabs for 1 day, 2 tabs for 1 day, then 1 tab for 1 day 21 tablet Chiquita Heckert A, NP   meloxicam (MOBIC) 7.5 MG tablet Take 1 tablet (7.5 mg total) by mouth daily. 30 tablet Uel Davidow A, NP     I have reviewed the PDMP during this encounter.   Janace Aris, NP 10/29/20 1223

## 2020-10-29 NOTE — Discharge Instructions (Addendum)
You have osteoarthritis of the knee  Prednisone daily for 6 days.  Take this with food.  After you are done with the prednisone taper you can take the meloxicam as needed for pain. Ace wrap to help with compression

## 2021-01-24 ENCOUNTER — Ambulatory Visit (HOSPITAL_COMMUNITY)
Admission: EM | Admit: 2021-01-24 | Discharge: 2021-01-24 | Disposition: A | Discharge status: Home / Self Care | Payer: Self-pay | Attending: Student | Admitting: Student

## 2021-01-24 ENCOUNTER — Encounter (HOSPITAL_COMMUNITY): Payer: Self-pay

## 2021-01-24 DIAGNOSIS — Z6841 Body Mass Index (BMI) 40.0 and over, adult: Secondary | ICD-10-CM

## 2021-01-24 DIAGNOSIS — M1712 Unilateral primary osteoarthritis, left knee: Secondary | ICD-10-CM

## 2021-01-24 MED ORDER — MELOXICAM 7.5 MG PO TABS: 7.5000 mg | ORAL_TABLET | Freq: Every day | ORAL | 0 refills | Status: AC

## 2021-01-24 MED ORDER — PREDNISONE 10 MG (21) PO TBPK: ORAL_TABLET | Freq: Every day | ORAL | 0 refills | Status: AC

## 2021-01-24 NOTE — ED Provider Notes (Signed)
[facility]    CSN: [id number] Arrival date & time: 01/24/21  1205      History   Chief Complaint Chief Complaint  Patient presents with  . Knee Pain    left    HPI [name] is a 40 y.o. female presenting again for chronic L knee OA. Has been evaluated by [place], PCP, ortho for this in the past. Pt presents with left knee pain x 6 months. Pt states she was here last year in November with the same pain and states has been told the pain was due to being over weight. Pt states she has been trying to get in to see PCP, but has been unable to recently. Pt states the knee pain feels like it extends from her left knee down her shin. Pt states it is harder for her to bend her knee, and sometimes it feels like it's locked in place. States she has been unable to find a knee brace large enough. Denies new trauma since last visit with [place]. Denies back pian, hip pain, pain radiating down thigh.  HPI  Past Medical History:  Diagnosis Date  . Obesity     Patient Active Problem List   Diagnosis Date Noted  . HYPERLIPIDEMIA 06/07/2007  . DEPRESSION 06/07/2007  . HYPERTENSION 06/07/2007  . LOW BACK PAIN 06/07/2007    Past Surgical History:  Procedure Laterality Date  . CESAREAN SECTION      OB History   No obstetric history on file.      Home Medications    Prior to Admission medications   Medication Sig Start Date End Date Taking? Authorizing Provider  meloxicam (MOBIC) 7.5 MG tablet Take 1 tablet (7.5 mg total) by mouth daily. 01/24/21  Yes Rhys Martini, PA-C  predniSONE (STERAPRED UNI-PAK 21 TAB) 10 MG (21) TBPK tablet Take by mouth daily. Take 6 tabs by mouth daily  for 2 days, then 5 tabs for 2 days, then 4 tabs for 2 days, then 3 tabs for 2 days, 2 tabs for 2 days, then 1 tab by mouth daily for 2 days 01/24/21  Yes Rhys Martini, PA-C    Family History History reviewed. No pertinent family history.  Social History Social History   Tobacco Use  . Smoking  status: Current Every Day Smoker    Packs/day: 0.00    Types: Cigarettes  . Smokeless tobacco: Never Used  Substance Use Topics  . Alcohol use: No     Allergies   Doxycycline   Review of Systems Review of Systems  Musculoskeletal:       L knee pain and swelling.      Physical Exam Triage Vital Signs ED Triage Vitals  Enc Vitals Group     BP 01/24/21 1303 122/79     Pulse Rate 01/24/21 1303 60     Resp 01/24/21 1303 16     Temp 01/24/21 1303 98.1 F (36.7 C)     Temp Source 01/24/21 1303 Oral     SpO2 01/24/21 1303 98 %     Weight --      Height --      Head Circumference --      Peak Flow --      Pain Score 01/24/21 1306 7     Pain Loc --      Pain Edu? --      Excl. in GC? --    No data found.  Updated Vital Signs BP 122/79 (BP  Location: Left Arm)   Pulse 60   Temp 98.1 F (36.7 C) (Oral)   Resp 16   LMP 01/14/2021 (Approximate)   SpO2 98%   Visual Acuity Right Eye Distance:   Left Eye Distance:   Bilateral Distance:    Right Eye Near:   Left Eye Near:    Bilateral Near:     Constitutional:      General: She is not in acute distress.    Appearance: Normal appearance. She is obese. She is not ill-appearing, toxic-appearing or diaphoretic.  HENT:     Head: Normocephalic.     Nose: Nose normal.  Eyes:     Conjunctiva/sclera: Conjunctivae normal.  Pulmonary:     Effort: Pulmonary effort is normal.  Musculoskeletal:     Cervical back: Normal range of motion.     Left knee: Swelling and crepitus, bony tenderness present. Decreased range of motion. Tenderness present.  Skin:    General: Skin is warm and dry.     Findings: No rash.  Neurological:     Mental Status: She is alert.  Psychiatric:        Mood and Affect: tearful  UC Treatments / Results  Labs (all labs ordered are listed, but only abnormal results are displayed) Labs Reviewed - No data to display  EKG   Radiology No results found.  Procedures Procedures (including  critical care time)  Medications Ordered in UC Medications - No data to display  Initial Impression / Assessment and Plan / UC Course  I have reviewed the triage vital signs and the nursing notes.  Pertinent labs & imaging results that were available during my care of the patient were reviewed by me and considered in my medical decision making (see chart for details).     This patient is a 40 year old female presenting for acute on chronic exacerbation of L knee OA. Has been evaluted for this by ortho, PCP, and this urgent care. At her last visit 10/2020, xray was performed:   10/2020 xray L knee 1. Tricompartmental degenerative changes with mild joint space loss in the lateral compartment. 2.  No acute osseous abnormality or joint effusion is evident.  Again discussed that her OA is related to age and overweight.  Prednisone and meloxicam sent as below. This regimen provided some relief at her last visit here. She is not a diabetic. Ace wrap applied. rec f/u with ortho to discuss options for pain control, including joint injections.   This chart was dictated using voice recognition software, Dragon. Despite the best efforts of this provider to proofread and correct errors, errors may still occur which can change documentation meaning.  Spent over 40 minutes obtaining H&P, performing physical, discussing results, treatment plan and plan for follow-up with patient. Patient agrees with plan.     Final Clinical Impressions(s) / UC Diagnoses   Final diagnoses:  Primary osteoarthritis of left knee  Class 3 severe obesity due to excess calories without serious comorbidity with body mass index (BMI) of 40.0 to 44.9 in adult [facility])     Discharge Instructions     -For pain, try the meloxicam. You can take one pill daily, with food. Don't take ibuprofen while taking this medication. You can continue tylenol. -Start the prednisone taper as directed. -You can try the ace wrap while  working to provide yourself with some support.  -At home, rest your knee, and elevate it at the end of the day. You can also try icing it.  -  If your pain persists, follow-up with your orthopedist to discuss additional options for pain management.     ED Prescriptions    Medication Sig Dispense Auth. Provider   meloxicam (MOBIC) 7.5 MG tablet Take 1 tablet (7.5 mg total) by mouth daily. 30 tablet Rhys Martini, PA-C   predniSONE (STERAPRED UNI-PAK 21 TAB) 10 MG (21) TBPK tablet Take by mouth daily. Take 6 tabs by mouth daily  for 2 days, then 5 tabs for 2 days, then 4 tabs for 2 days, then 3 tabs for 2 days, 2 tabs for 2 days, then 1 tab by mouth daily for 2 days 42 tablet Rhys Martini, PA-C     PDMP not reviewed this encounter.   Rhys Martini, PA-C 01/24/21 1352

## 2021-01-24 NOTE — ED Triage Notes (Signed)
Pt presents with left knee pain x 6 months. Pt states she was here last year in August with the same pain and states the pain was due to being over weight. Pt states she has been trying to get in to have  PCP. Pt states the knee pain is radiating to the lower extremity of the leg.Pt states it is harder for her to bend her knee.

## 2021-01-24 NOTE — Discharge Instructions (Addendum)
-  For pain, try the meloxicam. You can take one pill daily, with food. Don't take ibuprofen while taking this medication. You can continue tylenol. -Start the prednisone taper as directed. -You can try the ace wrap while working to provide yourself with some support.  -At home, rest your knee, and elevate it at the end of the day. You can also try icing it.  -If your pain persists, follow-up with your orthopedist to discuss additional options for pain management.

## 2021-11-05 IMAGING — DX DG KNEE COMPLETE 4+V*L*
4 series · 4 of 4 positions shown · non-contrast
Comparison: None.

CLINICAL DATA: 39-year-old female with increasing chronic knee
pain.

EXAM:
LEFT KNEE - COMPLETE 4+ VIEW

[knee ap]
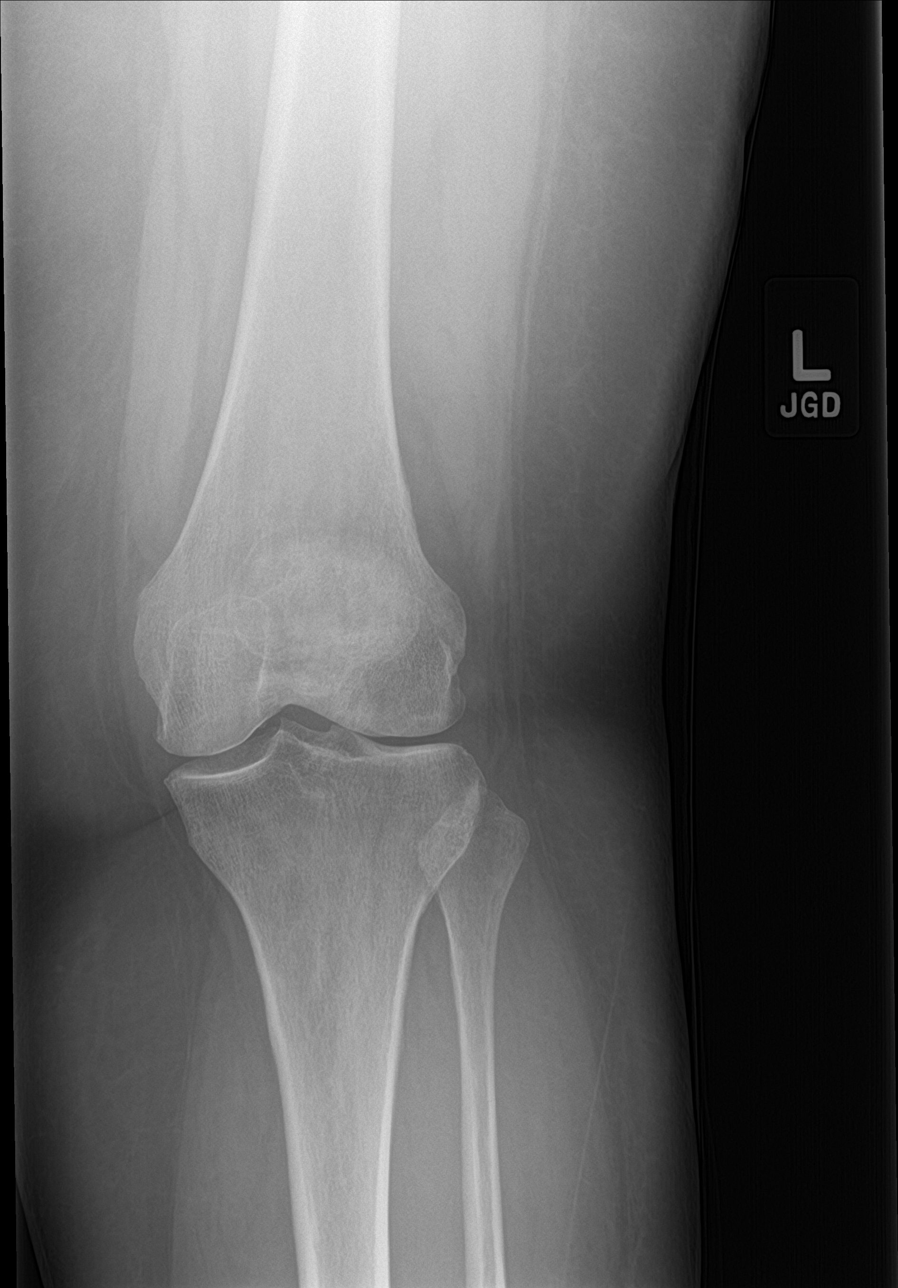

[knee obl]
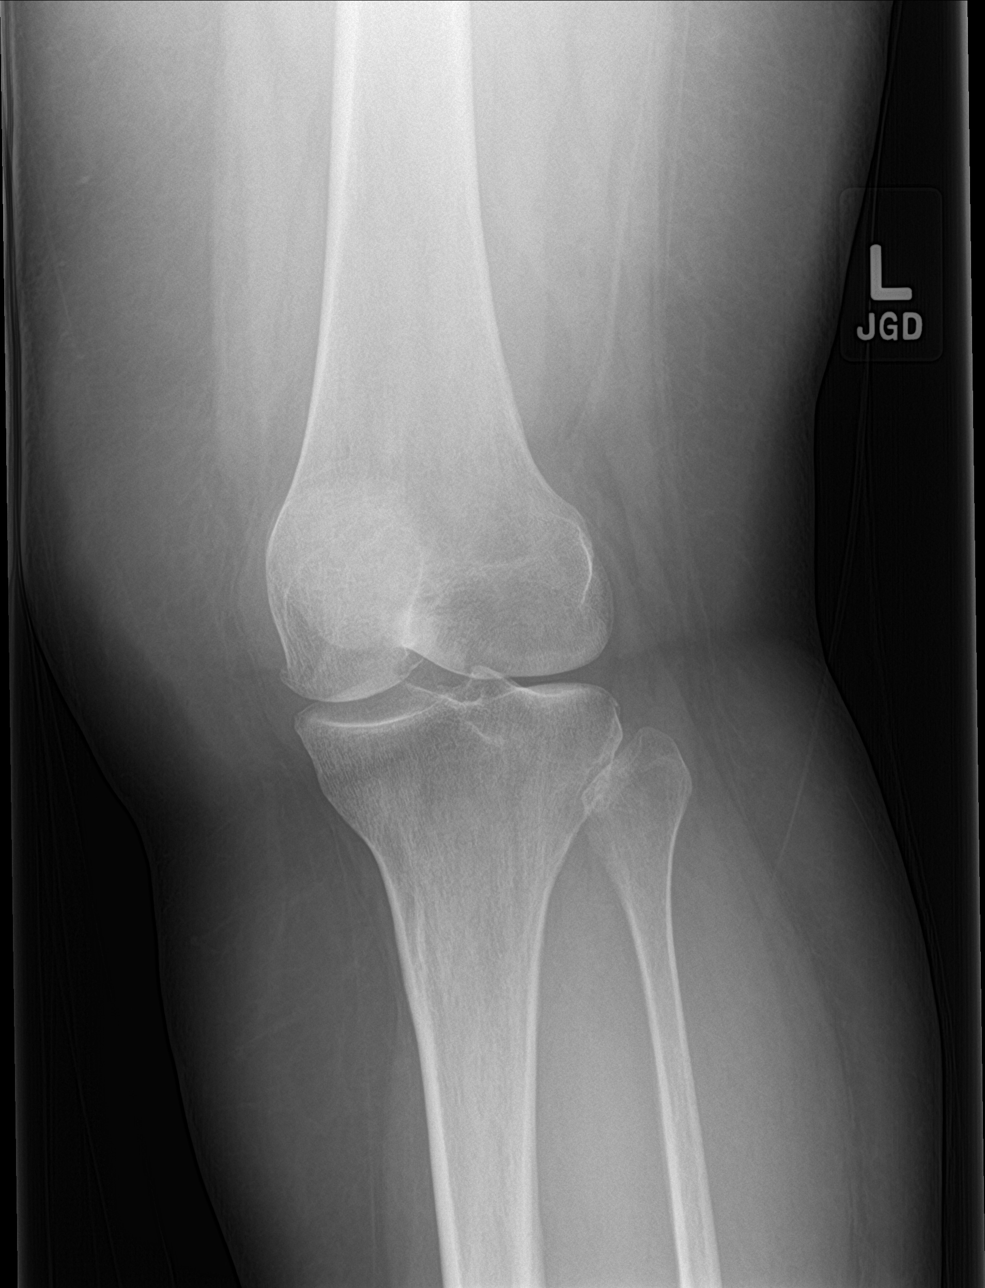

[knee lat]
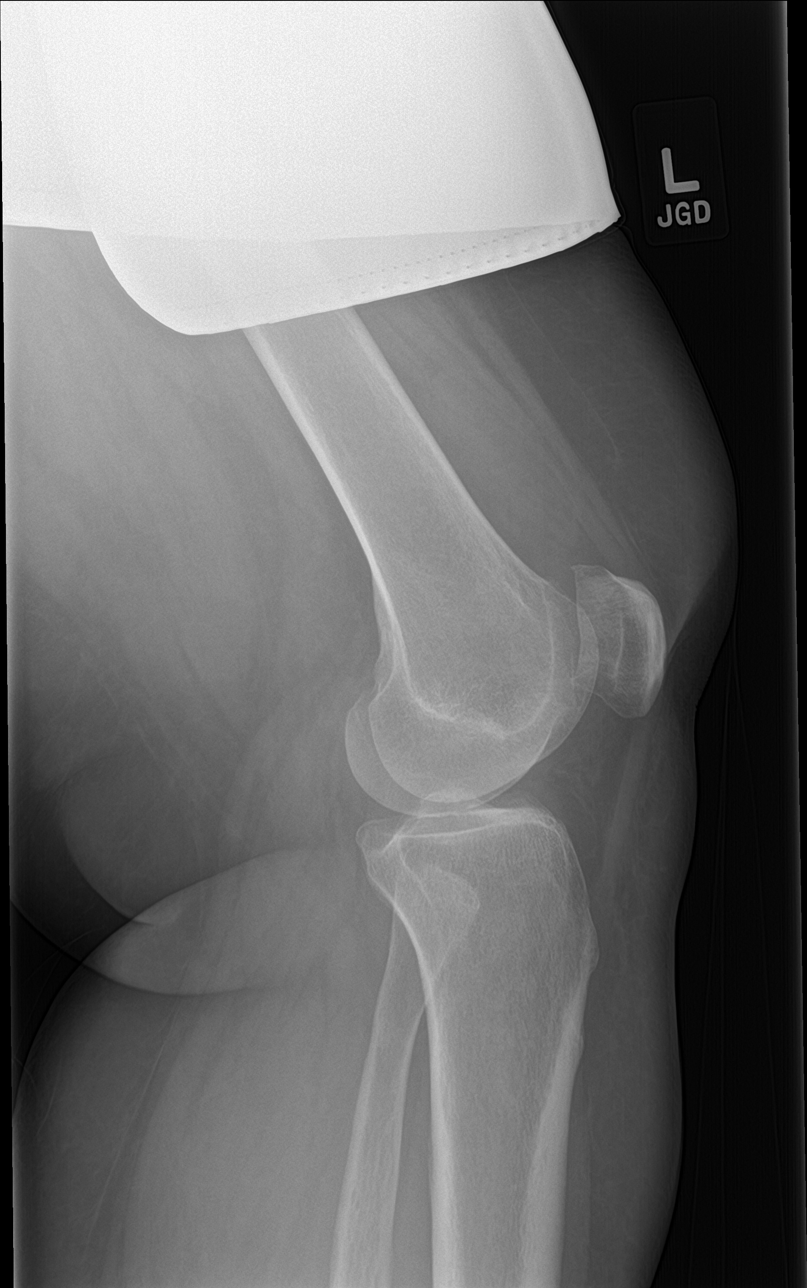

[knee sunrise]
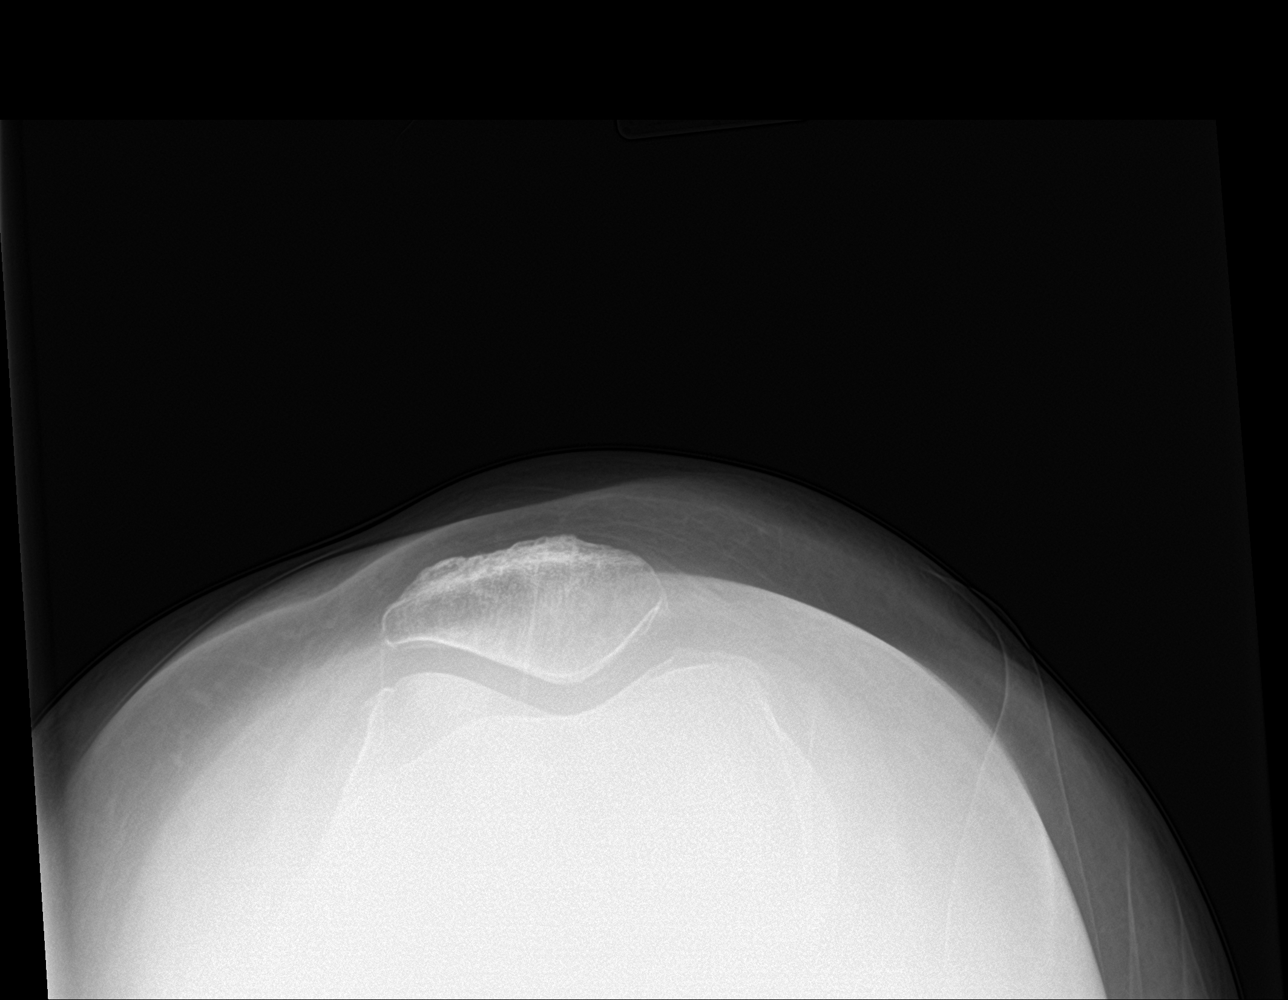

[4 of 4 positions shown; findings below may reference images not displayed]

FINDINGS: Normal background bone mineralization. Preserved alignment at the
left knee. Mild lateral joint space loss and subchondral sclerosis.
Patellofemoral joint space appears normal although there is mild
patellofemoral spurring. Similarly, medial joint space preserved but
with mild spurring. No joint effusion. No acute osseous abnormality
identified.
IMPRESSION: 1. Tricompartmental degenerative changes with mild joint space loss
in the lateral compartment.
2.  No acute osseous abnormality or joint effusion is evident.
